# Patient Record
Sex: Male | Born: 1990
Health system: Southern US, Community
[De-identification: ages and names within clinical notes are randomized; demographics above are authoritative.]

## PROBLEM LIST (undated history)

## (undated) DIAGNOSIS — J939 Pneumothorax, unspecified: Secondary | ICD-10-CM

## (undated) HISTORY — DX: Pneumothorax, unspecified: J93.9

---
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
## 2010-01-04 ENCOUNTER — Emergency Department (HOSPITAL_COMMUNITY): Admission: EM | Admit: 2010-01-04 | Discharge: 2010-01-04 | Payer: Self-pay | Admitting: Family Medicine

## 2010-01-15 ENCOUNTER — Encounter: Admission: RE | Admit: 2010-01-15 | Discharge: 2010-01-15 | Payer: Self-pay | Admitting: Family Medicine

## 2010-01-20 ENCOUNTER — Encounter: Admission: RE | Admit: 2010-01-20 | Discharge: 2010-01-20 | Payer: Self-pay | Admitting: Family Medicine

## 2010-01-22 ENCOUNTER — Emergency Department (HOSPITAL_COMMUNITY): Admission: EM | Admit: 2010-01-22 | Discharge: 2010-01-22 | Payer: Self-pay | Admitting: Family Medicine

## 2010-06-28 ENCOUNTER — Encounter: Payer: Self-pay | Admitting: Cardiothoracic Surgery

## 2010-10-23 NOTE — H&P (Signed)
NAMERIAD, WAGLEY NO.:  [id number]   MEDICAL RECORD NO.:  [id number]          PATIENT TYPE:  AMB   LOCATION:  DFTL                         FACILITY:  [facility]   PHYSICIAN:  Kerin Perna, M.D.  DATE OF BIRTH:  [birth year]   DATE OF ADMISSION:  11/05/2005  DATE OF DISCHARGE:                                HISTORY & PHYSICAL   CHIEF COMPLAINT:  Recurrent left spontaneous pneumothorax.   HISTORY OF PRESENT ILLNESS:  The patient is a 20 year old white male who was  hospitalized at [facility] on Oct 22, 2005, through Oct 26, 2005,  for a left-sided spontaneous pneumothorax for which he was treated with a  chest tube. He had a follow-up chest x-ray which reveals recurrence of the  pneumothorax. It was Dr. Zenaida Niece Trigt's opinion that the patient should be  admitted for video-assisted thoracoscopy with probable bleb stapling and  pleurodesis. The patient currently denies any cough or sputum production. He  denies hemoptysis. He does have some mild shortness of breath with dyspnea  on exertion. He denies paroxysmal nocturnal dyspnea or orthopnea. No recent  fever, chills, or unexplained weight loss. He has no history of pneumonia.  He has no peripheral edema. He has no palpitations. He does have mild chest  discomfort that he describes as subtle.   PAST MEDICAL HISTORY:  1.  ADHD.  2.  Seasonal allergies.  3.  Hydrocele, age 35 months.   ALLERGIES:  No known drug allergies.   CURRENT MEDICATIONS:  1.  Concerta 54 mg daily, taken only on school days.  2.  Allegra p.r.n.   REVIEW OF SYSTEMS:  See the history of present illness for pertinent  positives and negatives; otherwise unremarkable.   SOCIAL HISTORY:  He is a high [employer]. He lives with his family. He  denies alcohol or tobacco use.   FAMILY HISTORY:  Negative for spontaneous pneumothorax. His family history  does include heart disease and cancer on his father's side.   PHYSICAL  EXAMINATION:  VITAL SIGNS: Blood pressure 100/60, heart rate 88,  respirations 12.  GENERAL APPEARANCE: A 20 year old Caucasian male in no acute distress.  HEENT: Normocephalic and atraumatic. Pupils equal, round, and reactive to  light. He does appear sensitive to bright lights. Extraocular movements are  intact. Oral mucosa is pink and moist. Teeth are in good condition. Mouth  and pharynx reveal no lesions. Posterior pharynx is clear of exudates or  erythema. Sclera is anicteric.  NECK: Supple with no jugular venous distention, no lymphadenopathy.  PULMONARY:  Symmetrical on inspiration and unlabored. Breath sounds are  clear, but diminished on the left compared with the right. There are no  rhonchi, wheezes, or crackles.  CARDIAC: Regular rate and rhythm without murmurs, rubs, or gallops.  ABDOMEN: Soft, nontender, nondistended. Normoactive bowel sounds. The  abdomen is flat, no bruits, no masses.  GENITOURINARY/RECTAL EXAM: Deferred.  EXTREMITIES: No edema. No cyanosis or edema. Peripheral pulses are equal and  intact bilaterally.  NEUROLOGIC: Nonfocal. He is alert and oriented times four. Gait is steady.  Muscle strength is  5+ and equal bilaterally grossly.   ASSESSMENT:  Recurrent left spontaneous pneumothorax.   PLAN:  Left video-assisted thoracoscopy and other procedures depending on  the findings during the procedure.      Rowe Clack, P.A.-C.      Kerin Perna, M.D.  Electronically Signed    WEG/MEDQ  D:  11/04/2005  T:  11/04/2005  Job:  [id number]   cc:   Gretta Arab. Valentina Lucks, M.D.  Fax: [phone]

## 2010-10-23 NOTE — Op Note (Signed)
NAMETIP, ATIENZA NO.:  [id number]   MEDICAL RECORD NO.:  [id number]          PATIENT TYPE:  INP   LOCATION:  2310                         FACILITY:  [facility]   PHYSICIAN:  Kerin Perna, M.D.  DATE OF BIRTH:  07-Jul-1990   DATE OF PROCEDURE:  11/26/2005  DATE OF DISCHARGE:                                 OPERATIVE REPORT   OPERATION:  1.  Left VATS (video-assisted thoracoscopic surgery) with stapling of apical      bleb, pleurectomy.  2.  Placement of On-Q wound irrigation system.   PREOPERATIVE DIAGNOSIS:  Recurrent spontaneous left pneumothorax, status  post prior video-assisted thoracoscopy one month ago.   POSTOPERATIVE DIAGNOSIS:  Recurrent spontaneous left pneumothorax, status  post prior video-assisted thoracoscopy one month ago.   SURGEON:  Kerin Perna, M.D.   ASSISTANT:  Coral Ceo, P.A.-C.   ANESTHESIA:  General by Dr. Judie Petit.   INDICATIONS:  The patient is a 20 year old thin white male who was treated  previously with left VATS and stapling of an apical bleb for spontaneous  pneumothorax.  He was discharged home with his lung virtually fully inflated  but returned with a gradually progressive left pneumothorax.  He was  admitted, and a Heimlich dart was inserted to re-expand the lung.  A CT scan  was performed which showed the prior surgical changes but no obvious  residual bleb disease.  He was prepared for redo VATS or minithoracotomy to  deal with the recurrent spontaneous pneumothorax.   Prior to surgery, I discussed the situation and the results of the x-ray  studies with the patient and his family and parents.  I discussed the plan  to perform a redo VATS and minithoracotomy to explore for any residual bleb  disease and to do a more aggressive pleurectomy.  The patient and parents  understood these issues and agreed to proceed under what I felt was an  informed consent.   PROCEDURE:  The patient was brought to  operating room and placed supine on  the operating table where general anesthesia was induced.  A double-lumen  endotracheal tube was positioned by the anesthesiologist, and the patient  was turned and left chest was prepped and draped as a sterile field.  A  small incision was made beneath the tip of the left scapula, and the thorax  was entered in the fourth interspace.  The previous staple lines were  inspected and found to be healing with a fibrinous exudate.  There is some  inflammation of the parietal pleura but no significant adhesions.  It is  felt that because of the postoperative changes with adherence of the upper  lobe to the lower lobe in a fissure that an open technique would allow  greater exposure of the lung, provide a more effective pleurectomy, and  would be indicated in this patient with a recurrent spontaneous  pneumothorax.  The portal incision was then extended for approximately 4 to  5 cm, and the small rib spreader was inserted.  The rib was not divided.  Through this small incision, the  upper lobe and lower lobe were examined.  There was no obvious visual bleb disease.  The superior segment of the right  lower lobe at the apex had some thickening, and it was decided to remove the  tip of the superior segment with a Endo-GIA stapler, and this was performed.  The staple line was complete and was hemostatic.  The prior staple lines  were inspected, and these were fairly well healed.  There is an area of  granulation tissue, slightly raw appearing, and this was compressed with a  new staple line using the Ethicon no-cut stapler.  The inferior pulmonary  ligament was partially taken down to allow mobility of the lung to expand  and fill the apex.  The lung was tested under water with warm saline, and  there was no air leak produced by ventilating the left lung.   The left lung was deflated.  A pleurectomy was then performed  circumferentially around the upper left  hemithorax and apex.  Hemostasis was  obtained by electrocautery.  Some of the pleural tissue was sent for  specimen.  The staple lines were inspected and reinforced with the CoSeal  adhesive material.  The lungs were again checked under water for air leak,  and no air leak was appreciated.  Two chest tubes were placed and brought  out through separate incisions using a long tunnel to drain the anterior-  posterior left hemithorax.  These were secured to the skin.  The On-Q  catheter was then placed parallel to the incision and connected to the  reservoir.  The lung was then re-expanded under direct vision.   The incision was closed with interrupted pericostal sutures of #2 Vicryl.  The chest wall muscle was closed in layers using interrupted #1 Vicryl.  The  subcutaneous and skin were closed in running Vicryl.  Sterile dressings were  applied.  The patient was extubated and returned to the recovery room in  stable condition.      Kerin Perna, M.D.  Electronically Signed     PV/MEDQ  D:  11/26/2005  T:  11/27/2005  Job:  [id number]

## 2010-10-23 NOTE — H&P (Signed)
[name], [name] NO.:  [id number]   MEDICAL RECORD NO.:  [id number]          PATIENT TYPE:  INP   LOCATION:  5016                         FACILITY:  [facility]   PHYSICIAN:  Kerin Perna, M.D.  DATE OF BIRTH:  Nov 01, 1990   DATE OF ADMISSION:  11/25/2005  DATE OF DISCHARGE:                                HISTORY & PHYSICAL   CHIEF COMPLAINT:  Recurrent left spontaneous pneumothorax.   HISTORY OF PRESENT ILLNESS:  This is a 20 year old, Caucasian male with  history of recurrent pneumothorax.  The patient was first hospitalized May  18, through May 22, for left-sided spontaneous pneumothorax, treated with  chest tube placement.  The patient had a follow-up x-ray and office visit  which showed recurrent pneumothorax.  The patient was then admitted to [facility] on June 30, and underwent a left VATS and bleb resection on  June 1.  The patient was discharged home on November 11, 2005.  The patient was  scheduled to have a followup appointment on November 26, 2005, however, last  evening, he heard a popping sound when he breathed.  The patient presented  to the CVTS office today on June 21.  The patient also noticed a popping  sound this morning.  He has not heard any popping sounds since.  The patient  did have complaint of some shortness of breath, but currently his breathing  okay.  The patient denies any orthopnea, PND, nausea, vomiting or fever.  The patient denies any cough, sputum production, hemoptysis, reflux  symptoms, chills, peripheral edema, angina, palpitations or TIA symptoms.   PAST MEDICAL HISTORY:  1.  Recurrent left spontaneous pneumothorax, status post left VATS on November 05, 2005.  2.  ADHD.  3.  Seasonal allergies.  4.  Hydrocele at age 24 months.   PAST SURGICAL HISTORY:  VATS with left apical blood resection and  pleurodesis on November 05, 2005.   ALLERGIES:  No known drug allergies.   MEDICATIONS:  1.  Ultram 50 mg 1-2 tablets p.o. 4  hours p.r.n.  2.  Concerta 55 mg p.o. daily.  3.  Allegra p.o. daily.  4.  Nasal spray p.r.n.  5.  Vitamin A, C and multivitamin daily.   REVIEW OF SYSTEMS:  See HPI for certain positives and negatives, otherwise  negative for hypothyroidism and diabetes mellitus.  The patient's review of  systems is unremarkable.   SOCIAL HISTORY:  The patient is single and lives with his family.  He denies  any use of alcohol and tobacco.  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   FAMILY HISTORY:  The patient has a positive family history of coronary  artery disease and cancer on his father's side.   PHYSICAL EXAMINATION:  VITAL SIGNS:  Stable.  The patient is afebrile.  GENERAL:  The is a 20 year old, Caucasian male in no acute distress.  HEENT:  Normocephalic, atraumatic.  Pupils equal, round and reactive to  light accommodation.  Extraocular movements intact.  Oral mucosa pink and  moist.  Sclerae nonicteric.  NECK:  Neck supple.  No bruits heard.  RESPIRATORY:  Symmetrical on inspiration.  Breathing is unlabored.  The  patient has diminished breath sounds on the left side.  CARDIAC:  Regular  rate and rhythm.  No murmur, gallop or rub.  ABDOMEN:  Soft, nontender, nondistended, normoactive bowel sounds x4.  GENITALIA/RECTAL:  Deferred.  EXTREMITIES:  No edema.  Radial, femoral, popliteal, dorsalis pedis and  posterior tibial pulses 2+ bilaterally.  NEUROLOGIC:  Nonfocal.  Alert and oriented x4.  Gait steady.  The patient's  muscle strength 5+ bilaterally and throughout.  Deep tendon reflexes 2+ and  symmetrical.   ASSESSMENT:  Recurrent left spontaneous pneumothorax.   PLAN:  1.  The patient admitted to [facility] on November 25, 2005, under Dr.      Zenaida Niece Trigt's service.  2.  The patient will have a chest tube placement by Dr. Dorris Fetch at      bedside.  3.  Dr. Dorris Fetch has seen and evaluated the patient prior to admission.      Risks and benefits of the procedure were  explained and he agreed to      proceed.  4.  Standard admission orders.      Constance Holster, [place]      Kerin Perna, M.D.  Electronically Signed    JMW/MEDQ  D:  11/25/2005  T:  11/25/2005  Job:  [id number]

## 2010-10-23 NOTE — Op Note (Signed)
NAME:  CONSTANT, MANDEVILLE NO.:  [id number]   MEDICAL RECORD NO.:  [id number]            PATIENT TYPE:   LOCATION:                                 FACILITY:   PHYSICIAN:  Kerin Perna, M.D.       DATE OF BIRTH:   DATE OF PROCEDURE:  11/08/2005  DATE OF DISCHARGE:                                 OPERATIVE REPORT   OPERATION:  Left chest tube placement for recurrent pneumothorax.   PRE-AND-POSTOPERATIVE DIAGNOSIS:  Spontaneous left pneumothorax.   SURGEON:  Kerin Perna, M.D.   ANESTHESIA:  MAC with 1% lidocaine local.   PROCEDURE:  [name] was brought to operating room for placement of a new  chest tube after he developed a large left pneumothorax following removal of  his chest tube on the floor.  It was felt that he had air enter into the  left pleural space during the removal of the chest tube.  He was brought to  the operating room where MAC anesthesia was established using IV Diprivan  and local 1% lidocaine.  The left chest was prepped and draped.   The prior chest tube site, which was 29 days old, was prepped and draped and  anesthetized with lidocaine.  A tunnel through the incision was made up the  chest wall to enter the pleural space at a more oblique angle to allow more  soft tissue to separate the two entry-and-exit sites.  This was secured with  a silk suture.  A chest x-ray was taken in the room to confirm adequate  placement; and the patient had a sterile dressing applied.      Kerin Perna, M.D.  Electronically Signed     PV/MEDQ  D:  11/08/2005  T:  11/09/2005  Job:  [id number]
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
## 2010-10-23 NOTE — Discharge Summary (Signed)
NAMECHAY, MAZZONI NO.:  [id number]   MEDICAL RECORD NO.:  [id number]          PATIENT TYPE:  INP   LOCATION:  2001                         FACILITY:  [facility]   PHYSICIAN:  Kerin Perna, M.D.  DATE OF BIRTH:  June 17, 1990   DATE OF ADMISSION:  11/25/2005  DATE OF DISCHARGE:  12/09/2005                                 DISCHARGE SUMMARY   ADMISSION DIAGNOSIS:  Recurrent spontaneous left pneumothorax status post  video assisted thoracoscopy 1 month ago.   SECONDARY DIAGNOSIS:  1.  Recurrent spontaneous left pneumothorax status post prior video assisted      thoracoscopy 1 month ago, status post redo left video assisted      thoracoscopy with bleb stapling and pleurectomy.  2.  Attention deficit hyperactivity disorder.  3.  Seasonal allergies.  4.  Hydrocele at age 57 months.   ALLERGIES:  NO KNOWN DRUG ALLERGIES.   PROCEDURES:  Left video assisted thoracoscopic surgery with stapling of the  apical bleb and pleurectomy by Dr. Kerin Perna on November 26, 2005.   BRIEF HISTORY:  Mr. Gullo is a 20 year old Caucasian male with a history of  recurrent pneumothorax.  He was first hospitalized on May 18th through May  22nd for a left-sided spontaneous pneumothorax treated with chest tube  placement.  The patient had a follow-up chest x-ray at the office, which  showed recurrent pneumothorax.  He was readmitted to [facility] on  June 30th and underwent left VATS and bleb resection with mechanical  pleurodesis on November 05, 2005.  He was discharged home on June 7th.  He was  scheduled to have a followup with Dr. Donata Clay on June 22nd, however, on  the evening of June 20th he heard a popping sound when he breathed.  He  presented to the CVTS office on June 21st and continued to notice a popping  sound.  He also complained of some shortness of breath, but was in no acute  distress.  It was felt he should be admitted for a chest tube placement and  consideration  for surgical evaluation.   HOSPITAL COURSE:  On November 25, 2005 Mr. Laino was admitted to [facility] for recurrent left spontaneous pneumothorax despite having  undergone a previous VATS procedure with bleb stapling and mechanical  pleurodesis.  Initially, a left chest tube was placed for his recurrent left  pneumothorax at approximately 50%.  He underwent a chest CT with contrast  for further evaluation.  Findings showed pleural parenchymal density at the  left apex with small loculated hydropneumothorax.  There appeared to be some  pleural in folding and retraction or tenting at the left lung apex.  Visceral pleura appeared retracted toward the side of the surgical  staples/scarring.  Although no blebs were identified on CT scan, Dr. Donata Clay felt he should undergo another left VATS procedure for further  evaluation and probable bleb stapling.  He was taken to the operating room  on June 22nd as discussed above.  He did perform resection of what appeared  to  be left apical bleb.  This was confirmed by surgical biopsy.  Postoperatively, he was transferred to the SICU overnight and was felt  stable to transfer to step-down unit 3300 by postoperative day 2.  Over the  first few days postoperatively his chest tube remained at 20 cm of suction  with a stable chest x-ray showing the lungs fully expanded, but with what  was felt to be a small left apical space.  One of his 2 chest tubes was  removed within the first few days of surgery.  Later his remaining chest  tube was placed to water seal, but there was a question of a slight increase  in the left apical pneumothorax, so was again placed to suction.  He was  allowed to disconnect suction with ambulation.  By June 30th it was  anticipated he would be placed to water seal, however, during morning rounds  it appeared that he had a 1/7 intermittent air leak.  This persisted for the  next 24 hours, but with no significant change in  his x-ray, which again  showed less than 5% left apical space.  At this point it was felt this  probably most likely was not actually due to a true air leak.  Subsequently,  his Pleur-evac system was changed out.  Another stitch was placed around his  chest tube site and a Vaseline gauze was changed.  Then over the next 24  hours or so the questionable air leak resolved.  On July 4th it was  determined he was ready for his remaining chest tube to be discontinued.  His follow-up chest x-ray showed no significant change.  He was also  breathing comfortably and incision appeared to be healing without signs of  infection.  He remained afebrile and vital signs stable.  Subsequently, he  was felt appropriate for discharge home on December 09, 2005.  Along with chest  tube management, there were some smaller issues addressed during his  hospitalization including constipation, which was addressed with laxatives,  probable lower back muscle spasm, which was addressed with a warm compress,  and tachycardia with ambulation with heart rate up to 150, but subsided when  at rest and it was felt no additional was warranted, as it was self-limited.   DISPOSITION:  Mr. Lankford was discharged home on December 09, 2005 in stable  condition.   DISCHARGE MEDICATIONS:  Ultram 50 mg 1-2 tablets p.o. q.6 hours p.r.n. pain.   DISCHARGE INSTRUCTIONS:  He may have a regular diet.  He is to avoid driving  and heavy lifting for the next 3 weeks.  He is also to avoid strenuous  activity and should increase his activity slowly.  He may shower in 2 days  and clean his incision gently with soap and water.  He may remove his 4 x 4  gauze and Vaseline gauze dressing as well in 2 days.  He should call if he  develops fevers greater than 101, redness or draining from incision site or  increasing shortness of breath.   FOLLOWUP:  He is to follow up with Dr. Kathlee Nations Trigt in the CVTS office on Wednesday, July 11th at 4 p.m. with a  chest x-ray at [facility] at 3  p.m. and he has another follow-up appointment with Dr. Donata Clay on  Wednesday, July 25th at 4 p.m. with a chest x-ray at 3 p.m.      Jerold Coombe, P.A.      Theron Arista  Donata Clay, M.D.  Electronically Signed    AWZ/MEDQ  D:  12/09/2005  T:  12/09/2005  Job:  [id number]   cc:   Kerin Perna, M.D.  [address]  [place]  [place] [address]

## 2010-10-23 NOTE — Discharge Summary (Signed)
Jordan [name], SPORN NO.:  [id number]   MEDICAL RECORD NO.:  [id number]          PATIENT TYPE:  INP   LOCATION:  3305                         FACILITY:  [facility]   PHYSICIAN:  Kerin Perna, M.D.  DATE OF BIRTH:  1991/05/26   DATE OF ADMISSION:  11/05/2005  DATE OF DISCHARGE:  11/11/2005                                 DISCHARGE SUMMARY   ADDITIONAL DIAGNOSIS:  Recurrent left spontaneous pneumothorax.   DISCHARGE DIAGNOSES:  1.  Recurrent left spontaneous pneumothorax, status post left video-assisted      thorascopic surgery .  2.  ADHD.  3.  Seasonal allergies.  4.  Hydrocele age 46 months.   CONSULTATIONS:  None.   PROCEDURES:  On November 05, 2005, the patient underwent a left VATS with left  apical blood resection and pleurodesis insertion of a 26 cm chest tube by  Dr. Kathlee Nations Trigt.  Patient's chest tube reinserted  on November 08, 2005, by  Dr. Kathlee Nations Trigt.   HISTORY OF PRESENT ILLNESS:  The patient is a 20 year old white male who was  hospitalized at [facility] on May 18-22, 2007, for left sided  spontaneous pneumothorax where he was treated with a chest tube.  The  patient had follow up chest x-ray which revealed recurrence of pneumothorax.  It was best thought that the patient should be admitted for left video-  assisted thorascopy with probable blood stapling and pleurodesis .  The  patient currently denies any cough or sputum production.  He denies any  hemoptysis.  He does have some mild shortness of breath with dyspnea on  exertion.  The patient denies PND or orthopnea.  No fever or chills or  explained weight loss.  The patient has no history of pneumonia.  No  peripheral edema.  No palpitations.  He does have mild chest discomfort that  he describes as supple.   HOSPITAL COURSE:  The patient postoperatively progressed well.  On postop  day #1, the patient's pain was controlled with Dilaudid PCA.  He did not  have chest tube.  Air leak  and __________ was minimal.  The patient's vital  signs are stable.  Postop day #2, chest x-ray was stable.  Lungs were clear.  The patient's chest tube was placed with water seal.  PCA was discontinued.  On postop day #3, the patient's chest x-ray showed a mild apical  pneumothorax, less than 5%.  His chest tube was discontinued on November 08, 2005.  Follow up chest x-ray showed current left pneumothorax, and the chest  tube was replaced under MAC.  On November 09, 2005, the patient has a mild tiny  apical pneumothorax.  His lungs were clear.  Plan was to put the patient on  Water Seal.  On November 10, 2005, the patient did not have an air leak and chest  tube will be discontinued later this afternoon by Dr. Donata Clay.  Throughout  the patient's hospital stay, his vital signs have remained stable.  The  patient is ambulating well on his own.   DISPOSITION:  The patient will be discharged to home in good condition.   MEDICATIONS:  1.  Ultram 1-2 tablets p.o. q.4 h p.r.n.  2.  Concerta 54 mg p.o. daily.  3.  Allegra p.o. daily.  4.  Nasal spray as needed.  5.  Vitamin A, vitamin C and multivitamin daily.   DISCHARGE INSTRUCTIONS:  The patient is instructed to follow a regular diet.  No driving or heavy lifting greater than 10 pounds for three weeks.  The  patient is to ambulate three to four times daily .  Activity as tolerated.  The patient is to continue his breathing exercises.  He may shower and clean  with mild soap and water.  The patient is to call the office with any  problems such as incision redness, drainage, temperature greater than 101.5.  The patient is to call the office if he experiences any increased shortness  of breath.   FOLLOWUP:  The patient has follow up appointment with Dr. Donata Clay on November 19, 2005, at 12:45 p.m. where he will have a chest x-ray taken prior to  seeing Dr. Donata Clay.      Constance Holster, [place]      Kerin Perna, M.D.  Electronically  Signed    JMW/MEDQ  D:  11/10/2005  T:  11/11/2005  Job:  [id number]

## 2010-10-23 NOTE — Op Note (Signed)
[name], [name] NO.:  [id number]   MEDICAL RECORD NO.:  [id number]          PATIENT TYPE:  INP   LOCATION:  2028                         FACILITY:  [facility]   PHYSICIAN:  Kerin Perna, M.D.  DATE OF BIRTH:  August 23, 1990   DATE OF PROCEDURE:  10/22/2005  DATE OF DISCHARGE:                                 OPERATIVE REPORT   OPERATION:  Left chest tube placement.   PREOPERATIVE DIAGNOSIS:  Spontaneous left pneumothorax.   POSTOPERATIVE DIAGNOSIS:  Spontaneous left pneumothorax.   SURGEON:  Kerin Perna, M.D.   ANESTHESIA:  Local 1% lidocaine and intravenous conscious sedation.   PROCEDURE:  Mr. Meech is a 20 year old white male who presented with a 50%  spontaneous pneumothorax which was symptomatic.  Informed consent was  obtained from the parent prior to this procedure.  The patient was given  supplemental IV of Versed and fentanyl, and the left chest was prepped and  draped as a sterile field.  Local 1% lidocaine was infiltrated in the left  breast crease.  A 2 cm incision was made, and lidocaine was infiltrated into  the subcutaneous tissue down to the intercostal muscle.  The left pleural  space was entered, and a large volume air was expelled.  The tract was  dilated gently, and a 20-French chest tube was advanced over a trocar into  the pleural space and directed to the apex.  It was secured to the skin with  two silk sutures, and a sterile dressing was placed.  The chest tube was  connected to underwater Pleur-Evac drainage system.  A portable chest x-ray  is pending.      Kerin Perna, M.D.  Electronically Signed     PV/MEDQ  D:  10/22/2005  T:  10/23/2005  Job:  [id number]   cc:   Gretta Arab. Valentina Lucks, M.D.  Fax: [phone]   CVTS office

## 2010-11-11 ENCOUNTER — Encounter: Payer: Self-pay | Admitting: Family Medicine

## 2010-11-11 ENCOUNTER — Ambulatory Visit (INDEPENDENT_AMBULATORY_CARE_PROVIDER_SITE_OTHER): Payer: BC Managed Care – PPO | Admitting: Family Medicine

## 2010-11-11 VITALS — BP 119/60 | Ht 71.0 in | Wt 173.0 lb

## 2010-11-11 DIAGNOSIS — M2140 Flat foot [pes planus] (acquired), unspecified foot: Secondary | ICD-10-CM | POA: Insufficient documentation

## 2010-11-11 DIAGNOSIS — R269 Unspecified abnormalities of gait and mobility: Secondary | ICD-10-CM | POA: Insufficient documentation

## 2010-11-11 NOTE — Progress Notes (Signed)
  Subjective:    Patient ID: Jordan [name], male    DOB: [birth year], 20 y.o.   MRN: [id number]  HPI 20 year old male referred by Dr. Margaretha Sheffield for evaluation of orthotics. 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 Margaretha Sheffield about it. He was given a Sleeve as well as some rehabilitation exercises. At that time it was noted he had significant pes planus with overpronation, thus he is referred to [place]. He states still has some calf pain but it is better. He has not tried running any more. He does not have foot pain at this time. He was previously using Vibram minimal shoes.  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 He does not smoke.  Review of Systems Denies fever, sweats, chills or loss. Also denies knee pain, hip pain, chest pain, shortness of breath, vision change, or headaches.     Objective:   Physical Exam Gen. appearance: Well-appearing male in no distress Neck supple Psych: Normal Neuro: Alert and oriented ENT: Moist mucous membranes Speech: Normal Lungs: No labored breathing Abdomen: Soft Bilateral feet: Severe pes planus with overpronation and some mid foot breakdown. He also has significant transverse arch collapse with splaying of his toes and extremely broad forefoot. He has normal posterior tibial tendon function. He has no obvious abnormal callusing. Gait: He does over pronate, but seems he tries to compensate with some supination while walking. He is unable to run today for me because his calf still bothering him.       Assessment & Plan:  Lower leg pain with severe pes planus and some overpronation relating to a gait abnormality - custom orthotics, see procedure note below -Followup as needed for adjustments  Patient was fitted for a : standard, cushioned, semi-rigid  orthotic. The orthotic was heated and afterward the patient stood on the orthotic blank positioned on the orthotic stand. The patient was positioned in subtalar neutral position and 10 degrees of ankle dorsiflexion in a weight bearing stance. After completion of molding, a stable base was applied to the orthotic blank. The blank was ground to a stable position for weight bearing. Size:12 Base: EVA Posting: 1st ray posts b/l Additional orthotic padding:  I could not remove the insoles from his shoes today, thus he will take these with him when he goes to buy new running shoes and try them on then to.  I spent greater than 30 minutes with the patient today, greater than 50% spent in face-to-face counseling time on diagnosis, prognosis, and treatment as well as making of his orthotics.

## 2011-06-16 DIAGNOSIS — Z0271 Encounter for disability determination: Secondary | ICD-10-CM

## 2011-12-01 ENCOUNTER — Other Ambulatory Visit: Payer: Self-pay | Admitting: Cardiothoracic Surgery

## 2011-12-01 ENCOUNTER — Ambulatory Visit (INDEPENDENT_AMBULATORY_CARE_PROVIDER_SITE_OTHER): Payer: BC Managed Care – PPO | Admitting: Cardiothoracic Surgery

## 2011-12-01 ENCOUNTER — Encounter: Payer: Self-pay | Admitting: Cardiothoracic Surgery

## 2011-12-01 ENCOUNTER — Ambulatory Visit
Admission: RE | Admit: 2011-12-01 | Discharge: 2011-12-01 | Disposition: A | Discharge status: Home / Self Care | Payer: BC Managed Care – PPO | Source: Ambulatory Visit | Attending: Cardiothoracic Surgery | Admitting: Cardiothoracic Surgery

## 2011-12-01 VITALS — BP 110/65 | HR 60 | Resp 18 | Ht 71.0 in | Wt 170.0 lb

## 2011-12-01 DIAGNOSIS — Z8709 Personal history of other diseases of the respiratory system: Secondary | ICD-10-CM

## 2011-12-01 DIAGNOSIS — J939 Pneumothorax, unspecified: Secondary | ICD-10-CM

## 2011-12-01 DIAGNOSIS — Z09 Encounter for follow-up examination after completed treatment for conditions other than malignant neoplasm: Secondary | ICD-10-CM

## 2011-12-01 NOTE — Progress Notes (Signed)
PCP is No primary provider on file. Referring Provider is Maurice Small, MD  Chief Complaint  Patient presents with  . 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 He is a nonsmoker. He has no pulmonary symptoms whatsoever. He has no postthoracotomy pain. The surgical incisions are well-healed. He has no shortness of breath with heavy exertion. He has no problems lifting heavy weights. A followup chest x-ray today shows normal lung fields, no evidence of residual pneumothorax, no pleural effusion present.   No past medical history on file.  No past surgical history on file.  No family history on file.  Social History History  Substance Use Topics  . Smoking status: Never Smoker   . Smokeless tobacco: Not on file  . Alcohol Use: Not on file    No current outpatient prescriptions on file.    No Known Allergies  Review of Systems no fever no shortness of breath no weight loss no chest pain  BP 110/65  Pulse 60  Resp 18  Ht 5\' 11"  (1.803 m)  Wt 170 lb (77.111 kg)  BMI 23.71 kg/m2  SpO2 98% Physical Exam Alert and comfortable Neck without mass or JVD Thorax well-healed left mini thoracotomy incision and chest tube sites Cardiac rhythm regular murmur or gallop Extremities without deformity      normal pulses no edema Neuro exam without focal motor deficit ,normal gait Diagnostic Tests: Chest x-ray no active disease  Impression: Resolved bleb disease causing pneumothorax. No physical limitations to this patient's activity. No expectation of future pulmonary problems related to previous thoracic surgery.  Plan: Return as needed

## 2012-04-19 ENCOUNTER — Ambulatory Visit (INDEPENDENT_AMBULATORY_CARE_PROVIDER_SITE_OTHER): Payer: BC Managed Care – PPO | Admitting: Sports Medicine

## 2012-04-19 ENCOUNTER — Encounter: Payer: Self-pay | Admitting: Sports Medicine

## 2012-04-19 VITALS — BP 103/70 | HR 85 | Ht 71.0 in | Wt 170.0 lb

## 2012-04-19 DIAGNOSIS — M7740 Metatarsalgia, unspecified foot: Secondary | ICD-10-CM

## 2012-04-19 DIAGNOSIS — M7750 Other enthesopathy of unspecified foot: Secondary | ICD-10-CM

## 2012-04-19 DIAGNOSIS — M2140 Flat foot [pes planus] (acquired), unspecified foot: Secondary | ICD-10-CM

## 2012-04-19 NOTE — Progress Notes (Signed)
  Subjective:    Patient ID: Jordan [name], male    DOB: 1990-12-22, 21 y.o.   MRN: [id number]  HPI chief complaint: Left foot pain 21 year old male comes in today complaining of 5 weeks of left foot pain. He describes pain along the third metatarsal head which is worse with walking and running. His symptoms began acutely after he awoke after sleeping in the back of a car during a pig roast. For the first couple of weeks his pain was excruciating and he had difficulty bearing weight. Since that time his symptoms have improved but not resolved. He has seen a chiropractor and had x-rays of his foot which are available for review. He has a history of a tibial stress fracture on the right but the pain in his left foot is different than what he experienced with his previous stress fracture in his right lower leg. He's not noticed any swelling. He has been able to continue running. 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 Most of his pain occurs now while walking barefoot. He has custom orthotics which he wears in his running shoes but he does not wear these in his other shoes.    Review of Systems     Objective:   Physical Exam Well-developed, well-nourished. No acute distress  Left foot: There is tenderness to palpation at the third metatarsal head. Slight callus buildup here as well. There is no tenderness to palpation or percussion across the dorsum of his foot. No pain with metatarsal squeeze. He has significant pes planus and collapse of the transverse arch with standing. Neurovascular intact distally. Walking without a limp.  [facility] ultrasound of his left foot shows increased blood flow at the third metatarsal head. This is seen on the plantar aspect of his foot but the remainder of the metatarsal appears  to be within normal limits. Scan of the metatarsals dorsally is unremarkable. No obvious stress fracture. No abnormal hypoechogenicity.  X-rays of his left foot from a local chiropractor's office are reviewed. No abnormalities are seen. Specifically no evidence of stress fracture.      Assessment & Plan:  1. Left foot pain secondary to third metatarsalgia 2. Pes planus with transverse arch collapse  I reassured him that I see no evidence of stress fracture either clinically or on today's ultrasound. I do think he is dealing with metatarsalgia and I've given him some metatarsal pads on some sports insoles to wear in his regular shoes. He will also bring in his custom orthotics so we can fit does with metatarsal pads. He is now a Product manager with running which is a conscious change in his form. He was traditionally a heel striker. I recommended that he continue with his heel striking form or a more neutral striking pattern and tell his metatarsalgia resolves. At that point, he may want to remove the metatarsal pads from his orthotics. I reassured him that with continued relative rest and metatarsl pads that this should resolve. He can increase activity as tolerated. Follow up prn. 2. Pes planus with collapse of the transverse arch

## 2012-05-08 ENCOUNTER — Encounter: Payer: Self-pay | Admitting: Sports Medicine

## 2012-05-08 ENCOUNTER — Ambulatory Visit (INDEPENDENT_AMBULATORY_CARE_PROVIDER_SITE_OTHER): Payer: BC Managed Care – PPO | Admitting: Sports Medicine

## 2012-05-08 VITALS — BP 117/74 | HR 65 | Ht 71.0 in | Wt 170.0 lb

## 2012-05-08 DIAGNOSIS — M79609 Pain in unspecified limb: Secondary | ICD-10-CM

## 2012-05-08 DIAGNOSIS — M79673 Pain in unspecified foot: Secondary | ICD-10-CM

## 2012-05-08 NOTE — Progress Notes (Signed)
  Subjective:    Patient ID: Jordan [name], male    DOB: [birth year], 21 y.o.   MRN: [id number]  HPI Patient comes in with persistent concerns about his left foot. An ultrasound done previously showed increased Doppler flow around the third metatarsal head but no obvious stress fracture. He continues to have intermittent pain along the plantar aspect of his foot. His pain is sharp and lasts only one to 2 seconds. Seems to be most noticeable with toe flexion and extension with the most discomfort coming while doing pushups. He's been able to run without any pain. Not noticed any swelling. His mother was questioning whether or not he may need a walking boot for a period of time but the patient states that he is not currently limping.    Review of Systems     Objective:   Physical Exam Well-developed, well-nourished. No acute distress.  Left foot: There is some slight tenderness to palpation over the third metatarsal head. There is also pain with passive flexion and extension of the third toe. No soft tissue swelling. No tenderness to palpation across the dorsum of his foot. Neurovascularly intact distally. He walks without a limp.  Limited [facility] ultrasound of the left foot was performed. A longitudinal view of the plantar aspect of the third metatarsal head shows some edema surrounding the flexor tendon at this level. The neovascularity seen on his last scan is no longer apparent. There is no obvious stress fracture.       Assessment & Plan:  1. Persistent left foot pain likely secondary to flexor tendon strain  Reassurance regarding his ultrasound. I do not see any evidence of stress fracture nor do I think he would benefit from immobilization in a Cam Walker. He will avoid aggravating activities and can resume activity as tolerated. He has custom orthotics which appear to be in fairly good condition. He will followup when necessary.

## 2014-01-29 ENCOUNTER — Other Ambulatory Visit: Payer: Self-pay | Admitting: Physician Assistant

## 2014-01-29 ENCOUNTER — Ambulatory Visit
Admission: RE | Admit: 2014-01-29 | Discharge: 2014-01-29 | Disposition: A | Discharge status: Home / Self Care | Payer: BC Managed Care – PPO | Source: Ambulatory Visit | Attending: Physician Assistant | Admitting: Physician Assistant

## 2014-01-29 DIAGNOSIS — R079 Chest pain, unspecified: Secondary | ICD-10-CM

## 2014-06-26 ENCOUNTER — Other Ambulatory Visit: Payer: Self-pay | Admitting: Orthopedic Surgery

## 2014-06-26 DIAGNOSIS — M940 Chondrocostal junction syndrome [Tietze]: Secondary | ICD-10-CM

## 2014-07-02 ENCOUNTER — Ambulatory Visit
Admission: RE | Admit: 2014-07-02 | Discharge: 2014-07-02 | Disposition: A | Discharge status: Home / Self Care | Payer: 59 | Source: Ambulatory Visit | Attending: Orthopedic Surgery | Admitting: Orthopedic Surgery

## 2014-07-02 DIAGNOSIS — M940 Chondrocostal junction syndrome [Tietze]: Secondary | ICD-10-CM

## 2014-11-06 ENCOUNTER — Institutional Professional Consult (permissible substitution): Payer: 59 | Admitting: Emergency Medicine

## 2014-11-22 ENCOUNTER — Encounter: Payer: Self-pay | Admitting: Emergency Medicine

## 2014-11-22 ENCOUNTER — Other Ambulatory Visit: Payer: Self-pay | Admitting: Emergency Medicine

## 2014-11-22 ENCOUNTER — Ambulatory Visit (INDEPENDENT_AMBULATORY_CARE_PROVIDER_SITE_OTHER): Payer: 59 | Admitting: Emergency Medicine

## 2014-11-22 VITALS — BP 122/76 | HR 66 | Ht 72.0 in | Wt 182.0 lb

## 2014-11-22 DIAGNOSIS — R0600 Dyspnea, unspecified: Secondary | ICD-10-CM

## 2014-11-22 NOTE — Patient Instructions (Signed)
We will perform full pulmonary function testing. Depending on the results we may also consider further imaging of her chest or possibly an cardiopulmonary exercise test We will perform blood work either today or on your return visit Follow with Dr Lamonte Sakai next available with pulmonary function testing on the same day

## 2014-11-22 NOTE — Progress Notes (Signed)
Subjective:    Patient ID: Jordan [name], male    DOB: Jan 01, 1991, 24 y.o.   MRN: [id number]  HPI 24 year old never smoker with little past medical history the exception of resection of a left apical bleb by VATS and pleurectomy in 2007 after a spontaneous PTX.  Also with a hx peristernal costochondritis, was ultimately treated with pred with some improvement. He is referred for dyspnea. He reports that ever since '07 he has had some dyspnea, difficulty getting a full breath in. This can be random, but is worse after a workout or when laying supine, sometimes 10 minutes after. Not really associated w pain. The history is taken from the patient and his wife who is present. I have reviewed his prior notes from Dr. Prescott Gum, I have reviewed his CT scan of the chest from January 2016.    Review of Systems  Constitutional: Negative for fever, chills, activity change, appetite change and unexpected weight change.  HENT: Negative for congestion, dental problem, postnasal drip, rhinorrhea, sneezing, sore throat, trouble swallowing and voice change.   Eyes: Negative for visual disturbance.  Respiratory: Positive for chest tightness and shortness of breath. Negative for cough and choking.   Cardiovascular: Negative for chest pain and leg swelling.  Gastrointestinal: Negative for nausea, vomiting and abdominal pain.  Genitourinary: Negative for difficulty urinating.  Musculoskeletal: Negative for arthralgias.  Skin: Negative for rash.  Psychiatric/Behavioral: Negative for behavioral problems and confusion.    Past Medical History  Diagnosis Date  . Pneumothorax      Family History  Problem Relation Age of Onset  . Breast cancer Mother   . Prostate cancer Father   . Lung cancer Paternal Grandfather   . Bladder Cancer Father   . Skin cancer Father      History   Social History  . Marital Status: Married    Spouse Name: N/A  . Number of Children: N/A  . Years of Education: N/A    Occupational History  . Not on file.   Social History Main Topics  . Smoking status: Never Smoker   . Smokeless tobacco: Never Used  . Alcohol Use: Not on file  . Drug Use: Not on file  . Sexual Activity: Not on file   Other Topics Concern  . Not on file   Social History Narrative  Works as a Quarry manager.    No Known Allergies   No outpatient prescriptions prior to visit.   No facility-administered medications prior to visit.         Objective:   Physical Exam Filed Vitals:   11/22/14 1526  BP: 122/76  Pulse: 66  Height: 6' (1.829 m)  Weight: 182 lb (82.555 kg)  SpO2: 96%   Gen: Pleasant, well-nourished, in no distress,  normal affect  ENT: No lesions,  mouth clear,  oropharynx clear, no postnasal drip  Neck: No JVD, no TMG, no carotid bruits  Lungs: No use of accessory muscles, clear without rales or rhonchi  Cardiovascular: RRR, heart sounds normal, no murmur or gallops, no peripheral edema  Musculoskeletal: No deformities, no cyanosis or clubbing  Neuro: alert, non focal  Skin: Warm, no lesions or rashes    07/03/14 --  COMPARISON: Radiographs 01/29/2014  FINDINGS: There are apical blebs bilaterally as well as linear scarring in the left apex. The lungs are otherwise clear. No masses or nodules are evident. Central airways are clear.  There is no hilar or mediastinal adenopathy. There are no effusions. The  thoracic esophagus appears unremarkable.  The sternum, costosternal junctions and costochondral junctions appear unremarkable. No bone lesion, bony destruction, chest wall lesion, drainable collection or focal inflammatory change is evident.  IMPRESSION: Small apical blebs bilaterally. Mild linear scarring in the left apex likely relates to the history of prior thoracotomy and bleb resection. No sternal or chest wall abnormalities are evident to account for the described pain.     Assessment & Plan:  Dyspnea Based on his  description this sounds anatomical and restrictive. Interestingly he does not have exercise limitation. I suspect that it may relate to his prior pleurectomy and surgery for his bleb. Consider also other causes like hemidiaphragm immobility but I'll suspect this problem to affect him during exercise. Etiology of his blebs is unclear it may be due to was body habitus and tall stature. I would like to check an alpha-1 antitrypsin although there is no history of this problem in his family. We will perform full pulmonary function testing and then consider a cardiopulmonary exercise test and other imaging

## 2014-11-22 NOTE — Assessment & Plan Note (Signed)
Based on his description this sounds anatomical and restrictive. Interestingly he does not have exercise limitation. I suspect that it may relate to his prior pleurectomy and surgery for his bleb. Consider also other causes like hemidiaphragm immobility but I'll suspect this problem to affect him during exercise. Etiology of his blebs is unclear it may be due to was body habitus and tall stature. I would like to check an alpha-1 antitrypsin although there is no history of this problem in his family. We will perform full pulmonary function testing and then consider a cardiopulmonary exercise test and other imaging

## 2014-11-29 ENCOUNTER — Ambulatory Visit (HOSPITAL_COMMUNITY)
Admission: RE | Admit: 2014-11-29 | Discharge: 2014-11-29 | Disposition: A | Discharge status: Home / Self Care | Payer: 59 | Source: Ambulatory Visit | Attending: Emergency Medicine | Admitting: Emergency Medicine

## 2014-11-29 DIAGNOSIS — R0600 Dyspnea, unspecified: Secondary | ICD-10-CM | POA: Diagnosis present

## 2014-11-29 LAB — PULMONARY FUNCTION TEST
DL/VA % PRED: 99 %
DL/VA: 4.72 ml/min/mmHg/L
DLCO UNC % PRED: 124 %
DLCO unc: 41.93 ml/min/mmHg
FEF 25-75 PRE: 4.57 L/s
FEF 25-75 Post: 2.06 L/sec
FEF2575-%CHANGE-POST: -55 %
FEF2575-%PRED-POST: 43 %
FEF2575-%PRED-PRE: 96 %
FEV1-%Change-Post: -28 %
FEV1-%PRED-PRE: 110 %
FEV1-%Pred-Post: 78 %
FEV1-POST: 3.68 L
FEV1-PRE: 5.16 L
FEV1FVC-%Change-Post: -15 %
FEV1FVC-%PRED-PRE: 90 %
FEV6-%CHANGE-POST: -13 %
FEV6-%PRED-POST: 102 %
FEV6-%Pred-Pre: 118 %
FEV6-Post: 5.78 L
FEV6-Pre: 6.72 L
FEV6FVC-%Change-Post: 0 %
FEV6FVC-%Pred-Post: 101 %
FEV6FVC-%Pred-Pre: 100 %
FVC-%Change-Post: -15 %
FVC-%Pred-Post: 101 %
FVC-%Pred-Pre: 120 %
FVC-POST: 5.78 L
FVC-Pre: 6.87 L
POST FEV1/FVC RATIO: 64 %
PRE FEV1/FVC RATIO: 75 %
Post FEV6/FVC ratio: 100 %
Pre FEV6/FVC Ratio: 100 %
RV % pred: 128 %
RV: 2.06 L
TLC % PRED: 124 %
TLC: 8.8 L

## 2014-11-29 MED ORDER — ALBUTEROL SULFATE (2.5 MG/3ML) 0.083% IN NEBU
2.5000 mg | INHALATION_SOLUTION | Freq: Once | RESPIRATORY_TRACT | Status: AC
  Administered 2014-11-29: 2.5 mg via RESPIRATORY_TRACT

## 2015-01-16 ENCOUNTER — Ambulatory Visit: Payer: 59 | Admitting: Emergency Medicine

## 2015-09-30 ENCOUNTER — Emergency Department (HOSPITAL_BASED_OUTPATIENT_CLINIC_OR_DEPARTMENT_OTHER): Payer: Worker's Compensation

## 2015-09-30 ENCOUNTER — Emergency Department (HOSPITAL_BASED_OUTPATIENT_CLINIC_OR_DEPARTMENT_OTHER)
Admission: EM | Admit: 2015-09-30 | Discharge: 2015-09-30 | Disposition: A | Discharge status: Home / Self Care | Payer: Worker's Compensation | Attending: Emergency Medicine | Admitting: Emergency Medicine

## 2015-09-30 ENCOUNTER — Encounter (HOSPITAL_BASED_OUTPATIENT_CLINIC_OR_DEPARTMENT_OTHER): Payer: Self-pay | Admitting: *Deleted

## 2015-09-30 DIAGNOSIS — W230XXA Caught, crushed, jammed, or pinched between moving objects, initial encounter: Secondary | ICD-10-CM | POA: Diagnosis not present

## 2015-09-30 DIAGNOSIS — Y9389 Activity, other specified: Secondary | ICD-10-CM | POA: Diagnosis not present

## 2015-09-30 DIAGNOSIS — Z8709 Personal history of other diseases of the respiratory system: Secondary | ICD-10-CM | POA: Diagnosis not present

## 2015-09-30 DIAGNOSIS — Y990 Civilian activity done for income or pay: Secondary | ICD-10-CM | POA: Diagnosis not present

## 2015-09-30 DIAGNOSIS — S63501A Unspecified sprain of right wrist, initial encounter: Secondary | ICD-10-CM | POA: Diagnosis not present

## 2015-09-30 DIAGNOSIS — Y9289 Other specified places as the place of occurrence of the external cause: Secondary | ICD-10-CM | POA: Diagnosis not present

## 2015-09-30 DIAGNOSIS — S6991XA Unspecified injury of right wrist, hand and finger(s), initial encounter: Secondary | ICD-10-CM | POA: Diagnosis present

## 2015-09-30 MED ORDER — NAPROXEN 500 MG PO TABS: 500.0000 mg | ORAL_TABLET | Freq: Two times a day (BID) | ORAL | Status: DC

## 2015-09-30 NOTE — Discharge Instructions (Signed)
Many simple wrist injuries resolve without specific treatment. I recommend that you follow up with your employers occupational health physician.   Triangular Fibrocartilage Tear With Rehab A triangular fibrocartilage tear is a tear in the triangular fibrocartilage complex (TFCC). The TFCC is a structure within the wrist. The TFCC along with the ulnocarpal ligament help stabilize the pinky side of the hand and forearm bones. A tear in the TFCC causes pain, instability, and/or a clicking sensation in the wrist. SYMPTOMS   Pain, tenderness and/or inflammation around the pinky (ulnar) side of the wrist.  Pain that worsens with wrist movement, especially extension of the wrist, or gripping.  "Clicking" sensation with or without pain.  A crackling sound (crepitation) with motion of the wrist. CAUSES  Triangular fibrocartilage tears occur when a force is placed on the TFCC that is greater than it can withstand. Common ways the injury occurs include:  Degeneration of the TFCC that occurs with age.  Falling on an outstretched hand.  One of the forearm bones (the ulna) is longer than the other (the radius). This pinches the TFCC. RISK INCREASES WITH:  Activities that involve repetitive and/or strenuous wrist and hand motions (like racquet sports, golf, baseball, weightlifting or mountain biking).  Activities that have a risk of falling on an outstretched hand.  Poor strength and flexibility.  Poor technique. PREVENTION  Warm up and stretch properly before activity.  Maintain physical fitness:  Strength, flexibility and endurance.  Cardiovascular fitness.  Learn and use proper technique. When possible, have a coach correct improper technique. PROGNOSIS  If treated properly, the symptoms of triangular fibrocartilage tears usually resolve with non-surgical treatment. Occasionally surgery is necessary to eliminate the symptoms. RELATED COMPLICATIONS  Recurrent symptoms that result in  a long term problem.  Prolonged healing time, if improperly treated or re-injured.  Arthritis of the wrist.  Inability to return to a high level of play.  Wrist stiffness or decreased wrist function.  Risks of surgery: infection, bleeding, nerve damage or damage to surrounding tissues. TREATMENT Treatment initially involves resting from any activities that aggravate the symptoms, and the use of ice and medications to help reduce pain and inflammation. It may be necessary to keep the wrist still (immobilized) for a period of time to allow for healing of the wrist. After immobilization it is important to perform strengthening and stretching exercises to help regain strength and a full range of motion. These exercises may be completed at home or with a therapist. If symptoms persist for greater than 6 months despite non-surgical treatment, then surgery may be recommended. MEDICATION  If pain medication is necessary, then nonsteroidal anti-inflammatory medications, such as aspirin and ibuprofen, or other minor pain relievers, such as acetaminophen, are often recommended.  Do not take pain medication for 7 days before surgery.  Prescription pain relievers may be given if deemed necessary by your caregiver. Use only as directed and only as much as you need. COLD THERAPY Cold treatment (icing) relieves pain and reduces inflammation. Cold treatment should be applied for 10 to 15 minutes every 2 to 3 hours for inflammation and pain and immediately after any activity that aggravates your symptoms. Use ice packs or massage the area with a piece of ice (ice massage). SEEK MEDICAL CARE IF:  Treatment seems to offer no benefit, or the condition worsens.  Any medications produce adverse side effects.  Any complications from surgery occur:  Pain, numbness or coldness in the extremity operated upon.  Discoloration of the nail beds (  they become blue or gray) of the extremity operated upon.  Signs of  infections (fever, pain, inflammation, redness or persistent bleeding).Gently grip a hammer or a soup ladle.  Wrist Sprain With Rehab A sprain is an injury in which a ligament that maintains the proper alignment of a joint is partially or completely torn. The ligaments of the wrist are susceptible to sprains. Sprains are classified into three categories. Grade 1 sprains cause pain, but the tendon is not lengthened. Grade 2 sprains include a lengthened ligament because the ligament is stretched or partially ruptured. With grade 2 sprains there is still function, although the function may be diminished. Grade 3 sprains are characterized by a complete tear of the tendon or muscle, and function is usually impaired. SYMPTOMS   Pain tenderness, inflammation, and/or bruising (contusion) of the injury.  A "pop" or tear felt and/or heard at the time of injury.  Decreased wrist function. CAUSES  A wrist sprain occurs when a force is placed on one or more ligaments that is greater than it/they can withstand. Common mechanisms of injury include:  Catching a ball with your hands.  Repetitive and/ or strenuous extension or flexion of the wrist. RISK INCREASES WITH:  Previous wrist injury.  Contact sports (boxing or wrestling).  Activities in which falling is common.  Poor strength and flexibility.  Improperly fitted or padded protective equipment. PREVENTION  Warm up and stretch properly before activity.  Allow for adequate recovery between workouts.  Maintain physical fitness:  Strength, flexibility, and endurance.  Cardiovascular fitness.  Protect the wrist joint by limiting its motion with the use of taping, braces, or splints.  Protect the wrist after injury for 6 to 12 months. PROGNOSIS  The prognosis for wrist sprains depends on the degree of injury. Grade 1 sprains require 2 to 6 weeks of treatment. Grade 2 sprains require 6 to 8 weeks of treatment, and grade 3 sprains require up  to 12 weeks.  RELATED COMPLICATIONS   Prolonged healing time, if improperly treated or re-injured.  Recurrent symptoms that result in a chronic problem.  Injury to nearby structures (bone, cartilage, nerves, or tendons).  Arthritis of the wrist.  Inability to compete in athletics at a high level.  Wrist stiffness or weakness.  Progression to a complete rupture of the ligament. TREATMENT  Treatment initially involves resting from any activities that aggravate the symptoms, and the use of ice and medications to help reduce pain and inflammation. Your caregiver may recommend immobilizing the wrist for a period of time in order to reduce stress on the ligament and allow for healing. After immobilization it is important to perform strengthening and stretching exercises to help regain strength and a full range of motion. These exercises may be completed at home or with a therapist. Surgery is not usually required for wrist sprains, unless the ligament has been ruptured (grade 3 sprain). MEDICATION   If pain medication is necessary, then nonsteroidal anti-inflammatory medications, such as aspirin and ibuprofen, or other minor pain relievers, such as acetaminophen, are often recommended.  Do not take pain medication for 7 days before surgery.  Prescription pain relievers may be given if deemed necessary by your caregiver. Use only as directed and only as much as you need. HEAT AND COLD  Cold treatment (icing) relieves pain and reduces inflammation. Cold treatment should be applied for 10 to 15 minutes every 2 to 3 hours for inflammation and pain and immediately after any activity that aggravates your symptoms. Use  ice packs or massage the area with a piece of ice (ice massage).  Heat treatment may be used prior to performing the stretching and strengthening activities prescribed by your caregiver, physical therapist, or athletic trainer. Use a heat pack or soak your injury in warm water. SEEK  MEDICAL CARE IF:  Treatment seems to offer no benefit, or the condition worsens.  Any medications produce adverse side effects. EXER

## 2015-09-30 NOTE — ED Notes (Signed)
Right wrist injury yesterday. Elevator door shut on his wrist. Workman's comp. He works for the city. No drug screen required.

## 2015-09-30 NOTE — ED Provider Notes (Signed)
CSN: [id number]     Arrival date & time 09/30/15  1824 History  By signing my name below, I, Helane Gunther, attest that this documentation has been prepared under the direction and in the presence of Tanna Furry, MD. Electronically Signed: Helane Gunther, ED Scribe. 09/30/2015. 7:25 PM.     Chief Complaint  Patient presents with  . Wrist Pain   The history is provided by the patient. No language interpreter was used.   HPI Comments: Jordan [name] is a 25 y.o. male who presents to the Emergency Department complaining of constant, burning right wrist pain onset after an injury sustained 1 day ago. Pt states he was trying to keep an elevator door open at work when his wrist was caught between the closing doors, causing his the joint to hyperextend, at which time pt felt a "pop." He notes this has happened in the past without any adverse effects, but he states that he now feels a burning pain to the ulnar point of the wrist, which has not resolved. He has also noticed some limitation to lifting anything with the right hand, especially with rotating his hands upward. He reports that he was seen for the similar past injuries, at which time he was diagnosed with a tendon strain. Pt denies any current swelling to the joint.   Past Medical History  Diagnosis Date  . Pneumothorax    History reviewed. No pertinent past surgical history. Family History  Problem Relation Age of Onset  . Breast cancer Mother   . Prostate cancer Father   . Lung cancer Paternal Grandfather   . Bladder Cancer Father   . Skin cancer Father    Social History  Substance Use Topics  . Smoking status: Never Smoker   . Smokeless tobacco: Never Used  . Alcohol Use: No    Review of Systems  Constitutional: Negative for fever, chills, diaphoresis, appetite change and fatigue.  HENT: Negative for mouth sores, sore throat and trouble swallowing.   Eyes: Negative for visual disturbance.  Respiratory: Negative for cough,  chest tightness and wheezing.   Gastrointestinal: Negative for nausea, vomiting, diarrhea and abdominal distention.  Endocrine: Negative for polydipsia, polyphagia and polyuria.  Genitourinary: Negative for dysuria, frequency and hematuria.  Musculoskeletal: Positive for arthralgias. Negative for joint swelling and gait problem.  Skin: Negative for color change, pallor and rash.  Neurological: Negative for dizziness, syncope and light-headedness.  Hematological: Does not bruise/bleed easily.  Psychiatric/Behavioral: Negative for behavioral problems and confusion.    Allergies  Review of patient's allergies indicates no known allergies.  Home Medications   Prior to Admission medications   Medication Sig Start Date End Date Taking? Authorizing Provider  naproxen (NAPROSYN) 500 MG tablet Take 1 tablet (500 mg total) by mouth 2 (two) times daily. 09/30/15   Tanna Furry, MD   BP 142/50 mmHg  Pulse 85  Temp(Src) 98.7 F (37.1 C) (Oral)  Resp 18  Ht 5\' 11"  (1.803 m)  Wt 175 lb (79.379 kg)  BMI 24.42 kg/m2  SpO2 99% Physical Exam  Constitutional: He is oriented to person, place, and time. He appears well-developed and well-nourished. No distress.  HENT:  Head: Normocephalic.  Eyes: Conjunctivae are normal. Pupils are equal, round, and reactive to light. No scleral icterus.  Neck: Normal range of motion. Neck supple. No thyromegaly present.  Cardiovascular: Normal rate and regular rhythm.  Exam reveals no gallop and no friction rub.   No murmur heard. Pulmonary/Chest: Effort normal and breath sounds  normal. No respiratory distress. He has no wheezes. He has no rales.  Abdominal: Soft. Bowel sounds are normal. He exhibits no distension. There is no tenderness. There is no rebound.  Musculoskeletal: Normal range of motion. He exhibits tenderness. He exhibits no edema.  TTP and pain with ROM over the right TFCC, no obvious soft-tissue swelling  Neurological: He is alert and oriented to  person, place, and time.  Skin: Skin is warm and dry. No rash noted.  Psychiatric: He has a normal mood and affect. His behavior is normal.    ED Course  Procedures  DIAGNOSTIC STUDIES: Oxygen Saturation is 99% on RA, normal by my interpretation.    COORDINATION OF CARE: 7:23 PM - Discussed XR results and plans to order a splint. Will write note for work to restrict pt to "light duty." Advised to take OTC anti-inflammatory medications, as well as to apply cold compresses. Pt advised of plan for treatment and pt agrees.  Labs Review Labs Reviewed - No data to display  Imaging Review Dg Wrist Complete Right  09/30/2015  CLINICAL DATA:  The patient's right wrist became caught between elevator doors yesterday. Continued pain. Initial encounter. EXAM: RIGHT WRIST - COMPLETE 3+ VIEW COMPARISON:  None. FINDINGS: There is no evidence of fracture or dislocation. There is no evidence of arthropathy. Small bone island in the scaphoid is incidentally noted. Soft tissues are unremarkable. IMPRESSION: Negative exam. Electronically Signed   By: Inge Rise M.D.   On: 09/30/2015 19:12   I have personally reviewed and evaluated these images and lab results as part of my medical decision-making.   EKG Interpretation None      MDM   Final diagnoses:  Wrist sprain, right, initial encounter    I personally performed the services described in this documentation, which was scribed in my presence. The recorded information has been reviewed and is accurate.   Tanna Furry, MD 09/30/15 [phone]

## 2016-11-15 DIAGNOSIS — M25512 Pain in left shoulder: Secondary | ICD-10-CM | POA: Diagnosis not present

## 2016-12-21 ENCOUNTER — Encounter: Payer: Self-pay | Admitting: Sports Medicine

## 2016-12-21 ENCOUNTER — Ambulatory Visit (INDEPENDENT_AMBULATORY_CARE_PROVIDER_SITE_OTHER): Payer: 59 | Admitting: Sports Medicine

## 2016-12-21 VITALS — BP 120/70 | Ht 71.0 in | Wt 180.0 lb

## 2016-12-21 DIAGNOSIS — M25512 Pain in left shoulder: Secondary | ICD-10-CM

## 2016-12-21 NOTE — Progress Notes (Signed)
  Chief complaint:  Left shoulder pain 6 months  History of present illness: Jordan [name] is a 26 year old Caucasian male, with past medical history notable for ADD and history of left spontaneous pneumothorax 11 years ago, status post two thirds upper lobe resection, who presents to the sports medicine office today with chief complaint of left shoulder pain. He reports that pain has been ongoing for the last 6 months. The reports of specific incidents approximately 7 months ago. He reports that the bedsheet one morning was covering his left shoulder, he was sitting on the rest of the bedsheet, reports that he was sitting up in the bed and the bedsheet "pulled my arm back". He reports since that time having a "deep" pain in his left shoulder. He cannot exactly pinpoint where an shoulder she feels the pain. He reports since that time having popping and catching on a daily basis. He does not report of any numbness, tingling, or weakness of his left arm. He is right hand dominant. He does not report of any warmth, erythema, ecchymosis, or effusion of left shoulder. He reports specific activities such as doing military presses, for flexion, and shoulder abduction causes pain. He has not tried any specific therapy, takes occasional ibuprofen for relief of symptoms. He has not had any imaging of his shoulder. He does not report of any previous injury to his left shoulder.  Past medical history: ADD Spontaneous pneumothorax status post left upper two thirds lobe of lungresection  Past surgical history: Left upper lobe two thirds lung resected secondary to spontaneous pneumothorax  Family history:  Father-bladder cancer and skin cancer Mother-skin cancer  Social history: Does not report of any tobacco, alcohol, or illicit drug use, is a Sports administrator in [employer]  Allergies: No known drug allergies  Medications: Adderall  Review of systems: Constitutional: No fevers, chills, or night  sweats Respiratory: No shortness of breath or cough Cardiac: No chest pain Integumentary: No rashes or pruritus  Physical exam: Gen: Lower, oriented, appears stated age, no apparent distress HEENT: Moist oral mucosa Respiratory: Normal respirations, speaks in full sentences Cardiac: Normal peripheral pulses, regular rate Integumentary: No rashes are visible skin Musculoskeletal:  Inspection of left shoulder does not reveal obvious deformity or atrophy, no warmth, erythema, ecchymosis, or effusion noted, no tenderness to palpation elicited and clavicle, acromioclavicular joint, coracoid, acromion, head of biceps, and scapular spine, range of motion intact both active and passively, speed's, Yergason, O'Brien, Neer's, drop arm, Michel Bickers, and [facility] all negative, crank equivocal, strength 5/5 bilateral upper extremity, sensation 2+ in bilateral upper extremity  Assessment and plan: 1. Left shoulder pain, concern for labral pathology  Left shoulder pain -Given physical exam findings concerning for, at least partial labral tear, discussed options of pursuing MRI arthrogram or just doing physical therapy for now, after discussion with patient he opts just to do physical therapy for now -If no improvement in symptoms in 4 weeks, we'll obtain MRI arthrogram to rule out labral tear  Will have patient follow-up in 4-6 weeks or sooner as needed.   Patient seen and evaluated with the sports medicine fellow. I agree with the above plan of care. Patient's history and physical exam findings are concerning for labral tear. He would like to try physical therapy first. Follow-up with me in 4-6 weeks for reevaluation. If no improvement, consider MRI arthrogram. Call with questions or concerns in the interim.

## 2017-01-05 DIAGNOSIS — M25512 Pain in left shoulder: Secondary | ICD-10-CM | POA: Diagnosis not present

## 2017-01-05 DIAGNOSIS — S46812D Strain of other muscles, fascia and tendons at shoulder and upper arm level, left arm, subsequent encounter: Secondary | ICD-10-CM | POA: Diagnosis not present

## 2017-01-11 DIAGNOSIS — M25512 Pain in left shoulder: Secondary | ICD-10-CM | POA: Diagnosis not present

## 2017-01-11 DIAGNOSIS — S46812D Strain of other muscles, fascia and tendons at shoulder and upper arm level, left arm, subsequent encounter: Secondary | ICD-10-CM | POA: Diagnosis not present

## 2017-01-18 ENCOUNTER — Ambulatory Visit: Payer: 59 | Admitting: Sports Medicine

## 2017-01-19 DIAGNOSIS — M25512 Pain in left shoulder: Secondary | ICD-10-CM | POA: Diagnosis not present

## 2017-01-19 DIAGNOSIS — S46812D Strain of other muscles, fascia and tendons at shoulder and upper arm level, left arm, subsequent encounter: Secondary | ICD-10-CM | POA: Diagnosis not present

## 2017-01-28 DIAGNOSIS — M25512 Pain in left shoulder: Secondary | ICD-10-CM | POA: Diagnosis not present

## 2017-01-28 DIAGNOSIS — S46812D Strain of other muscles, fascia and tendons at shoulder and upper arm level, left arm, subsequent encounter: Secondary | ICD-10-CM | POA: Diagnosis not present

## 2017-02-02 DIAGNOSIS — S46812D Strain of other muscles, fascia and tendons at shoulder and upper arm level, left arm, subsequent encounter: Secondary | ICD-10-CM | POA: Diagnosis not present

## 2017-02-02 DIAGNOSIS — M25512 Pain in left shoulder: Secondary | ICD-10-CM | POA: Diagnosis not present

## 2017-02-04 DIAGNOSIS — S46812D Strain of other muscles, fascia and tendons at shoulder and upper arm level, left arm, subsequent encounter: Secondary | ICD-10-CM | POA: Diagnosis not present

## 2017-02-04 DIAGNOSIS — M25512 Pain in left shoulder: Secondary | ICD-10-CM | POA: Diagnosis not present

## 2017-03-16 DIAGNOSIS — S46812D Strain of other muscles, fascia and tendons at shoulder and upper arm level, left arm, subsequent encounter: Secondary | ICD-10-CM | POA: Diagnosis not present

## 2017-03-16 DIAGNOSIS — M25512 Pain in left shoulder: Secondary | ICD-10-CM | POA: Diagnosis not present

## 2017-03-23 DIAGNOSIS — M25512 Pain in left shoulder: Secondary | ICD-10-CM | POA: Diagnosis not present

## 2017-03-23 DIAGNOSIS — S46812D Strain of other muscles, fascia and tendons at shoulder and upper arm level, left arm, subsequent encounter: Secondary | ICD-10-CM | POA: Diagnosis not present

## 2017-03-30 DIAGNOSIS — M25512 Pain in left shoulder: Secondary | ICD-10-CM | POA: Diagnosis not present

## 2017-03-30 DIAGNOSIS — S46812D Strain of other muscles, fascia and tendons at shoulder and upper arm level, left arm, subsequent encounter: Secondary | ICD-10-CM | POA: Diagnosis not present

## 2017-04-06 DIAGNOSIS — S46812D Strain of other muscles, fascia and tendons at shoulder and upper arm level, left arm, subsequent encounter: Secondary | ICD-10-CM | POA: Diagnosis not present

## 2017-04-06 DIAGNOSIS — M25512 Pain in left shoulder: Secondary | ICD-10-CM | POA: Diagnosis not present

## 2017-05-24 DIAGNOSIS — Z7721 Contact with and (suspected) exposure to potentially hazardous body fluids: Secondary | ICD-10-CM | POA: Diagnosis not present

## 2017-05-24 DIAGNOSIS — G479 Sleep disorder, unspecified: Secondary | ICD-10-CM | POA: Diagnosis not present

## 2017-05-24 DIAGNOSIS — L723 Sebaceous cyst: Secondary | ICD-10-CM | POA: Diagnosis not present

## 2017-06-13 DIAGNOSIS — Z131 Encounter for screening for diabetes mellitus: Secondary | ICD-10-CM | POA: Diagnosis not present

## 2017-06-13 DIAGNOSIS — Z0000 Encounter for general adult medical examination without abnormal findings: Secondary | ICD-10-CM | POA: Diagnosis not present

## 2017-08-22 DIAGNOSIS — K30 Functional dyspepsia: Secondary | ICD-10-CM | POA: Diagnosis not present

## 2018-01-20 DIAGNOSIS — L0293 Carbuncle, unspecified: Secondary | ICD-10-CM | POA: Diagnosis not present

## 2018-03-20 ENCOUNTER — Other Ambulatory Visit: Payer: Self-pay | Admitting: Physician Assistant

## 2018-03-20 ENCOUNTER — Ambulatory Visit
Admission: RE | Admit: 2018-03-20 | Discharge: 2018-03-20 | Disposition: A | Discharge status: Home / Self Care | Payer: 59 | Source: Ambulatory Visit | Attending: Physician Assistant | Admitting: Physician Assistant

## 2018-03-20 DIAGNOSIS — M25522 Pain in left elbow: Secondary | ICD-10-CM | POA: Diagnosis not present

## 2018-03-20 DIAGNOSIS — T1490XA Injury, unspecified, initial encounter: Secondary | ICD-10-CM

## 2018-03-20 DIAGNOSIS — J310 Chronic rhinitis: Secondary | ICD-10-CM | POA: Diagnosis not present

## 2018-03-20 DIAGNOSIS — D229 Melanocytic nevi, unspecified: Secondary | ICD-10-CM | POA: Diagnosis not present

## 2018-03-22 DIAGNOSIS — M7702 Medial epicondylitis, left elbow: Secondary | ICD-10-CM | POA: Diagnosis not present

## 2018-03-27 DIAGNOSIS — M7702 Medial epicondylitis, left elbow: Secondary | ICD-10-CM | POA: Diagnosis not present

## 2018-03-31 DIAGNOSIS — M7702 Medial epicondylitis, left elbow: Secondary | ICD-10-CM | POA: Diagnosis not present

## 2018-04-03 DIAGNOSIS — M7702 Medial epicondylitis, left elbow: Secondary | ICD-10-CM | POA: Diagnosis not present

## 2018-04-07 DIAGNOSIS — M7702 Medial epicondylitis, left elbow: Secondary | ICD-10-CM | POA: Diagnosis not present

## 2018-04-11 DIAGNOSIS — M7702 Medial epicondylitis, left elbow: Secondary | ICD-10-CM | POA: Diagnosis not present

## 2018-04-13 DIAGNOSIS — M546 Pain in thoracic spine: Secondary | ICD-10-CM | POA: Diagnosis not present

## 2018-05-22 ENCOUNTER — Other Ambulatory Visit: Payer: Self-pay | Admitting: Physician Assistant

## 2018-05-22 DIAGNOSIS — M25522 Pain in left elbow: Secondary | ICD-10-CM

## 2018-05-24 ENCOUNTER — Ambulatory Visit
Admission: RE | Admit: 2018-05-24 | Discharge: 2018-05-24 | Disposition: A | Discharge status: Home / Self Care | Payer: 59 | Source: Ambulatory Visit | Attending: Physician Assistant | Admitting: Physician Assistant

## 2018-05-24 DIAGNOSIS — M25522 Pain in left elbow: Secondary | ICD-10-CM

## 2018-05-24 DIAGNOSIS — S46211A Strain of muscle, fascia and tendon of other parts of biceps, right arm, initial encounter: Secondary | ICD-10-CM | POA: Diagnosis not present

## 2018-06-09 ENCOUNTER — Ambulatory Visit
Admission: RE | Admit: 2018-06-09 | Discharge: 2018-06-09 | Disposition: A | Discharge status: Home / Self Care | Payer: 59 | Source: Ambulatory Visit | Attending: Sports Medicine | Admitting: Sports Medicine

## 2018-06-09 ENCOUNTER — Encounter: Payer: Self-pay | Admitting: Sports Medicine

## 2018-06-09 ENCOUNTER — Other Ambulatory Visit: Payer: Self-pay | Admitting: Sports Medicine

## 2018-06-09 ENCOUNTER — Ambulatory Visit (INDEPENDENT_AMBULATORY_CARE_PROVIDER_SITE_OTHER): Payer: 59 | Admitting: Sports Medicine

## 2018-06-09 VITALS — BP 118/72 | Ht 72.0 in | Wt 183.0 lb

## 2018-06-09 DIAGNOSIS — M7702 Medial epicondylitis, left elbow: Secondary | ICD-10-CM | POA: Diagnosis not present

## 2018-06-09 DIAGNOSIS — M546 Pain in thoracic spine: Secondary | ICD-10-CM | POA: Diagnosis not present

## 2018-06-09 DIAGNOSIS — M549 Dorsalgia, unspecified: Secondary | ICD-10-CM | POA: Diagnosis not present

## 2018-06-09 MED ORDER — CYCLOBENZAPRINE HCL 10 MG PO TABS: 10.0000 mg | ORAL_TABLET | Freq: Every evening | ORAL | 0 refills | Status: DC | PRN

## 2018-06-09 MED ORDER — MELOXICAM 15 MG PO TABS: 15.0000 mg | ORAL_TABLET | Freq: Every day | ORAL | 1 refills | Status: DC

## 2018-06-09 NOTE — Progress Notes (Addendum)
HPI  CC: 3 left medial elbow pain  Jordan [name] a 28 year old male who presents for left medial elbow pain.  He states he has had this elbow pain for 3 and half months.  He was seen by his primary care doctor, who obtained an MRI.  The MRI showed a small interstitial tear of the flexor bundle on the left medial epicondyle.  He was in PT around 2 months ago.  He states this did help with the pain significantly.  He went back to full workouts, which he states made the pain significantly worse.  He has not been to physical therapy for the last 2 months.  He has been taking ibuprofen as needed.  He states is helped somewhat.  He was also on a small steroid dose 2 months ago prescribed by his PCP.  He denies any numbness and tingling his hand.  Denies any weakness of his hand.  He states the back pain is made worse with any flexion of his wrist.  He also reports mid back pain.  He states is been present for the last 6 weeks.  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  He states the pain is made worse when he does any kind of lifting.  He states is also made worse with any kind of side-to-side motion.  The pain is improved with rest.  He has been taking ibuprofen daily with some relief.  He denies any numbness tingling she down his legs.  Denies any weakness of his lower extremity.  He denies any balance issues.  See HPI and/or previous note for associated ROS.  Objective: BP 118/72   Ht 6' (1.829 m)   Wt 183 lb (83 kg)   BMI 24.82 kg/m  Gen: Right-Hand Dominant. NAD, well groomed, a/o x3, normal affect.  CV: Well-perfused. Warm.  Resp: Non-labored.  Neuro: Sensation intact throughout. No gross coordination deficits.  Gait: Nonpathologic posture, unremarkable stride without signs of limp or balance issues.  Left elbow exam: No erythema, warmth, swelling noted.  Tenderness palpation over the medial epicondyles.  Full range of motion in flexion and extension of the  elbow.  Full range of motion of flexion-extension of the wrist.  Pain with flexion of the wrist.  Strength 5 out of 5 throughout testing.  Negative UCL milk test.  Back exam: No erythema, warmth, swelling noted.  Tenderness palpation over the right paraspinal muscles around T2.  Full range of motion in flexion extension of the spine.  Full range of motion cytocide motion.  No pain with range of motion.  Strength out of 5 throughout lower extremity testing.  Negative straight leg raise bilaterally.  Negative stork test bilaterally.  Assessment and plan: 1.  Medial epicondylitis, with interstitial tear seen on MRI performed December 2019. 2.  Mechanical mid back pain  We discussed treatment options at today's visit.  We will start him back in physical therapy at this time for medial epicondylitis with a small interstitial tear.  We will also continue PT for his mid back pain.  I have started meloxicam 50 mg daily at this time.  Also given Flexeril at night for his back pain.  Will obtain x-ray 2 view of the T-spine to rule out any kind of acute fracture.  We will see back in 6 weeks in follow-up.  I will consider a surgical referral for medial epicondylitis in 6 weeks if he has no improvement.  Jordan Rife, MD  [facility] 06/09/2018 1:10 PM   Patient seen and evaluated with the sports medicine fellow.  I agree with the above plan of care.  We will check x-rays of his thoracic spine.  Call the patient with those results.  Patient will start physical therapy with Barbaraann Barthel for both his thoracic spine and his medial epicondylitis.  He needs to modify his workouts, primarily avoiding biceps and volar forearm work.  Meloxicam during the day.  Flexeril at night as needed for pain and spasm in the back.  Follow-up in 6 weeks for reevaluation.  Call with questions or concerns in the interim.  Addendum: Thoracic spine x-ray is unremarkable.  Patient notified of results.

## 2018-06-14 DIAGNOSIS — M7702 Medial epicondylitis, left elbow: Secondary | ICD-10-CM | POA: Diagnosis not present

## 2018-06-14 DIAGNOSIS — M546 Pain in thoracic spine: Secondary | ICD-10-CM | POA: Diagnosis not present

## 2018-06-20 DIAGNOSIS — M546 Pain in thoracic spine: Secondary | ICD-10-CM | POA: Diagnosis not present

## 2018-06-20 DIAGNOSIS — M7702 Medial epicondylitis, left elbow: Secondary | ICD-10-CM | POA: Diagnosis not present

## 2018-06-26 DIAGNOSIS — M546 Pain in thoracic spine: Secondary | ICD-10-CM | POA: Diagnosis not present

## 2018-06-26 DIAGNOSIS — M7702 Medial epicondylitis, left elbow: Secondary | ICD-10-CM | POA: Diagnosis not present

## 2018-06-30 DIAGNOSIS — M546 Pain in thoracic spine: Secondary | ICD-10-CM | POA: Diagnosis not present

## 2018-06-30 DIAGNOSIS — M7702 Medial epicondylitis, left elbow: Secondary | ICD-10-CM | POA: Diagnosis not present

## 2018-07-03 DIAGNOSIS — M7702 Medial epicondylitis, left elbow: Secondary | ICD-10-CM | POA: Diagnosis not present

## 2018-07-03 DIAGNOSIS — M546 Pain in thoracic spine: Secondary | ICD-10-CM | POA: Diagnosis not present

## 2018-07-07 DIAGNOSIS — M7702 Medial epicondylitis, left elbow: Secondary | ICD-10-CM | POA: Diagnosis not present

## 2018-07-07 DIAGNOSIS — M546 Pain in thoracic spine: Secondary | ICD-10-CM | POA: Diagnosis not present

## 2018-07-10 DIAGNOSIS — M546 Pain in thoracic spine: Secondary | ICD-10-CM | POA: Diagnosis not present

## 2018-07-10 DIAGNOSIS — M7702 Medial epicondylitis, left elbow: Secondary | ICD-10-CM | POA: Diagnosis not present

## 2018-07-18 DIAGNOSIS — J101 Influenza due to other identified influenza virus with other respiratory manifestations: Secondary | ICD-10-CM | POA: Diagnosis not present

## 2018-07-18 DIAGNOSIS — R509 Fever, unspecified: Secondary | ICD-10-CM | POA: Diagnosis not present

## 2018-07-18 DIAGNOSIS — M546 Pain in thoracic spine: Secondary | ICD-10-CM | POA: Diagnosis not present

## 2018-07-21 DIAGNOSIS — M7702 Medial epicondylitis, left elbow: Secondary | ICD-10-CM | POA: Diagnosis not present

## 2018-07-21 DIAGNOSIS — M546 Pain in thoracic spine: Secondary | ICD-10-CM | POA: Diagnosis not present

## 2018-07-24 ENCOUNTER — Encounter: Payer: Self-pay | Admitting: Sports Medicine

## 2018-07-24 ENCOUNTER — Ambulatory Visit (INDEPENDENT_AMBULATORY_CARE_PROVIDER_SITE_OTHER): Payer: 59 | Admitting: Sports Medicine

## 2018-07-24 VITALS — BP 124/68 | Ht 72.0 in | Wt 183.0 lb

## 2018-07-24 DIAGNOSIS — M549 Dorsalgia, unspecified: Secondary | ICD-10-CM | POA: Diagnosis not present

## 2018-07-24 DIAGNOSIS — M7702 Medial epicondylitis, left elbow: Secondary | ICD-10-CM | POA: Diagnosis not present

## 2018-07-24 DIAGNOSIS — M25511 Pain in right shoulder: Secondary | ICD-10-CM | POA: Diagnosis not present

## 2018-07-24 DIAGNOSIS — M546 Pain in thoracic spine: Secondary | ICD-10-CM | POA: Diagnosis not present

## 2018-07-24 MED ORDER — PREDNISONE 10 MG PO TABS: ORAL_TABLET | ORAL | 0 refills | Status: DC

## 2018-07-24 MED ORDER — CYCLOBENZAPRINE HCL 10 MG PO TABS: 10.0000 mg | ORAL_TABLET | Freq: Every evening | ORAL | 0 refills | Status: DC | PRN

## 2018-07-24 NOTE — Progress Notes (Signed)
Subjective:     Patient ID: Jordan [name], male   DOB: 05-27-1991, 28 y.o.   MRN: [id number]  HPI Jordan [name] is a 28yo with PMHx of scoliosis who is presenting for follow-up of his L medial epicondylitis and mid-thoracic back pain, as well as for evaluation of R "scapular" pain. His L elbow pain is slightly better since his last visit, having improved significantly with PT and stretching, but then gotten worse again with heat therapy. He has now been focusing on icing the elbow with a large ice pack to try to ease his symptoms. He does feel weak in that arm in particular with grip strength, though when his strength was formally tested it was largely identical to the R. It is unclear whether this pain was affected at all by the flexeril and meloxicam.  His mid-back pain is significantly improved after using muscle relaxer and NSAIDs. Now he says he feels a little "mass" on the R side of his mid-thoracic back that does not hurt when pressed, but he notices it when he massages his paraspinal area with a tennis ball. This mass moves and feels like "a muscle knot."  His scapular pain he says began around the same time as his back pain, but initially was not significant enough to discuss at his visit. Now he reports that this is the most significant of his symptoms. He says the pain moves along the medial border of his scapula, and is aggravated by face-pulls (rhomboid flexion) and lateral lifts (shoulder abduction) with weights. He does not feel clicking or instability, nor does he feel weakness of the shoulder. He says he had this same pain in his L shoulder initially, but that it went away with a course of steroids prior to his initial presentation here, and then reappeared on his R. This pain was not helped by meloxicam or flexeril, but it has improved with rest.   Review of Systems As above.    Objective:   Physical Exam  L elbow: no swelling, ecchymoses, erythema, or warmth; tenderness to palpation over  the medial condyle, no tenderness to palpation elsewhere in the joint; full ROM with extension and flexion without pain; full strength with extension, flexion, pronation, and supination without pain; no pain with ECRB stress, no pain with resisted flexion of wrist  R elbow: no swelling, ecchymoses, erythema, or warmth; no tenderness to palpation over the medial condyle or elsewhere in the joint; full ROM with extension and flexion without pain; full strength with extension, flexion, pronation, and supination without pain; no pain with ECRB stress, no pain with resisted flexion of wrist  Back: notable scoliosis with hypertrophy of R paraspinal muscles of the mid-thoracic area; no tenderness to palpation of the paraspinal muscles, unable to palpate a mass in the R paraspinal mid-thoracic area; full ROM with flexion and extension of the back; negative stork test, negative straight leg raise bilaterally  R shoulder: no effusion, ecchymoses, erythema, or warmth; no tenderness to palpation over the clavicle, AC joint, bicipital groove, or scapula; full ROM including abduction, flexion, extension, internal rotation, and external rotation without pain; full strength of abduction, flexion, extension, internal and external rotation; negative Neer's, Hawkins, Empty Can; no evidence of scapular dyskinesis  L shoulder: no effusion, ecchymoses, erythema, or warmth; no tenderness to palpation over the clavicle, AC joint, bicipital groove, or scapula; full ROM including abduction, flexion, extension, internal rotation, and external rotation without pain; full strength of abduction, flexion, extension, internal and external rotation; negative  Neer's, Hawkins, Empty Can; no evidence of scapular dyskinesis    Assessment:     L elbow medial epicondylitis: some improvement, will likely take some more time with rest and proper icing to improve. Instructed on icing technique with massage as opposed to large-area cooling which  can affect the ulnar nerve. Instructed on proper placement of the elbow strap. If no improvement in a few more weeks, he can get back in touch for PRP referral.  Mid-thoracic back pain: Resolved with medications and stretching.  R scapular pain: unclear etiology, most likely some element of bursitis given the "knot" he feels in his back, and could have an element of muscular strain that originates from his scoliosis. Will try a 6-day steroid treatment along with flexeril refill to target this pain. Instructed him to keep refraining from exercises that aggravate his pain, and since he wants to be building his shoulder muscles currently, offered him military presses and anterior lifts in the meantime. Will see back in 6 weeks to check back in.    Plan:     - Ice massage, elbow strap, and PT exercises for L elbow - Can consider PRP injection of L elbow if no improvement - Refill flexeril - 6-day prednisone treatment for R scapular pain  Jordan [name], MS4    Patient seen and evaluated with the medical student.  I agree with the above plan of care.  Left elbow medial epicondylitis is improving slowly.  We did discuss referral for PRP to Dr.T if symptoms do not resolve.  Treatment as above for his right scapular pain.  I will put him on a 6-day Sterapred Dosepak and refill his Flexeril.  Follow-up with me in 6 weeks for reevaluation.

## 2018-07-28 DIAGNOSIS — M546 Pain in thoracic spine: Secondary | ICD-10-CM | POA: Diagnosis not present

## 2018-07-28 DIAGNOSIS — M7702 Medial epicondylitis, left elbow: Secondary | ICD-10-CM | POA: Diagnosis not present

## 2018-07-31 DIAGNOSIS — M7702 Medial epicondylitis, left elbow: Secondary | ICD-10-CM | POA: Diagnosis not present

## 2018-07-31 DIAGNOSIS — M546 Pain in thoracic spine: Secondary | ICD-10-CM | POA: Diagnosis not present

## 2018-08-04 DIAGNOSIS — M546 Pain in thoracic spine: Secondary | ICD-10-CM | POA: Diagnosis not present

## 2018-08-04 DIAGNOSIS — M7702 Medial epicondylitis, left elbow: Secondary | ICD-10-CM | POA: Diagnosis not present

## 2018-08-11 DIAGNOSIS — M546 Pain in thoracic spine: Secondary | ICD-10-CM | POA: Diagnosis not present

## 2018-08-11 DIAGNOSIS — M7702 Medial epicondylitis, left elbow: Secondary | ICD-10-CM | POA: Diagnosis not present

## 2018-08-15 ENCOUNTER — Ambulatory Visit (INDEPENDENT_AMBULATORY_CARE_PROVIDER_SITE_OTHER): Payer: 59 | Admitting: Sports Medicine

## 2018-08-15 ENCOUNTER — Ambulatory Visit: Payer: Self-pay

## 2018-08-15 VITALS — BP 121/71 | Ht 72.0 in | Wt 185.0 lb

## 2018-08-15 DIAGNOSIS — M7702 Medial epicondylitis, left elbow: Secondary | ICD-10-CM

## 2018-08-15 NOTE — Progress Notes (Signed)
   HPI  CC: Left elbow pain  Baylee a 28 year old male presents for follow-up of left elbow pain.  He was last seen on 07/24/2018.  He was given exercises to work on for home therapy.  Also given meloxicam.  He is also been in physical therapy since that time.  He states that this thing seem to help with his pain.  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  He denies any numbness and tingling in his hand.  The pain is focal to the medial epicondyle of the elbow.  He also reports back pain that continues to persist.  States the pain is in the middle of his back.  Is on the right side, at the insertion of the trap.  He states the pain is worse when he does any exercise that engaged the trap muscle.  He has been using a ball to roll the area, which he states is painful but does help.  See HPI and/or previous note for associated ROS.  Objective: BP 121/71   Ht 6' (1.829 m)   Wt 185 lb (83.9 kg)   BMI 25.09 kg/m  Gen: Right-Hand Dominant. NAD, well groomed, a/o x3, normal affect.  CV: Well-perfused. Warm.  Resp: Non-labored.  Neuro: Sensation intact throughout. No gross coordination deficits.  Gait: Nonpathologic posture, unremarkable stride without signs of limp or balance issues.  Left elbow exam: No erythema, warmth, swelling noted.  Tenderness palpation over the medial epicondyle.  Full range of motion in the elbow.  Negative UCL milk test.  Back exam: No erythema, warmth, swelling noted.  Tenderness palpation noted focally at the midportion of the inferior insertion of the right trapezius muscle.  ULTRASOUND: Elbow, left Diagnostic limited ultrasound imaging obtained of patient's left shoulder.  -Medial epicondyle visualized, with hypoechoic changes noted at the insertion point of the common flexor tendon on the medial epicondyle.  There are also calcifications noted at the deep aspect of this attachment.  Partial tear visualized,  fluid noted as well. IMPRESSION: findings consistent with medial epicondylitis.   Assessment and plan: 1.  Left-sided medial epicondylitis 2.  Trigger point of the right trapezius muscle, inferior border.  We discussed treatment options at today's visit.  I did ultrasound as noted above.  This confirms what the MRI showed [place] from December 2019, that he has medial epicondylitis.  There is no to be interstitial tear on MRI as well.  This is also confirmed today on ultrasound.  We will refer him to Dr. Darene Lamer at today's visit for consideration of PRP injections.  We also discussed treatment of trigger points.  This includes continue with physical therapy, and considering doing dry needling and rolling exercises.  We will see him back for follow-up after the PRP injections.  [name], MD [facility] Fellow 08/15/2018 5:13 PM  Patient seen and evaluated with the sports medicine fellow.  I agree with the above plan of care.  We will refer the patient to Dr. Darene Lamer for possible PRP injection into the common flexor tendon.  Follow-up with me as needed.

## 2018-08-15 NOTE — Patient Instructions (Signed)
Thank you for coming see [place] today in clinic.  You were seen today for evaluation of your left elbow and mid back.  We will refer you to Dr.Thekkekandam for PRP injections.  His information is listed below.  Please call and schedule an appointment at your convenience.  Dr. Tereasa Coop [facility] [address] [place], [place], [place] [address] [phone]

## 2018-08-16 ENCOUNTER — Encounter: Payer: Self-pay | Admitting: Sports Medicine

## 2018-08-18 DIAGNOSIS — M7702 Medial epicondylitis, left elbow: Secondary | ICD-10-CM | POA: Diagnosis not present

## 2018-08-18 DIAGNOSIS — M546 Pain in thoracic spine: Secondary | ICD-10-CM | POA: Diagnosis not present

## 2018-08-25 ENCOUNTER — Ambulatory Visit (INDEPENDENT_AMBULATORY_CARE_PROVIDER_SITE_OTHER): Payer: 59 | Admitting: Sports Medicine

## 2018-08-25 ENCOUNTER — Other Ambulatory Visit: Payer: Self-pay

## 2018-08-25 DIAGNOSIS — M7702 Medial epicondylitis, left elbow: Secondary | ICD-10-CM

## 2018-08-25 MED ORDER — HYDROCODONE-ACETAMINOPHEN 10-325 MG PO TABS: 1.0000 | ORAL_TABLET | Freq: Three times a day (TID) | ORAL | 0 refills | Status: DC | PRN

## 2018-08-25 NOTE — Assessment & Plan Note (Addendum)
Refractory left medial epicondylitis. Failed activity modification, NSAIDs, therapy. Diagnosis confirmed on MRI. Decision made to perform percutaneous tenotomy with platelet rich plasma today. High-dose hydrocodone for postoperative pain. Avoid NSAIDs for now. Strapped with compressive dressing. Return to see me in 2 weeks to review reevaluate and we will also start formal physical therapy at that time, 2 weeks of gentle range of motion, 2 weeks of active range of motion, 2 to 4 weeks of strengthening, all eccentric.

## 2018-08-25 NOTE — Progress Notes (Signed)
Subjective:    I'm seeing this patient as a consultation for: Dr. Yvetta Coder, Dr. [name].  CC: Left elbow pain  HPI: This is a very pleasant 28 year old male, he has had at least 6 months of pain at the medial epicondyle, moderate, persistent, localized without radiation.  Worse with gripping.  Initially he had paresthesias going down and ulnar nerve distribution.  This has since resolved and has not recurred.  At this point he cannot lift weights, and has pain with almost every activity.  He has had physical therapy, activity modification, NSAIDs, no steroid injections.  Symptoms are severe, persistent, localized without radiation.  He is referred to me for further evaluation and definitive treatment as well as consideration of platelet rich plasma percutaneous tenotomy.  I reviewed the past medical history, family history, social history, surgical history, and allergies today and no changes were needed.  Please see the problem list section below in epic for further details.  Past Medical History: Past Medical History:  Diagnosis Date  . Pneumothorax    Past Surgical History: No past surgical history on file. Social History: Social History   Socioeconomic History  . Marital status: Married    Spouse name: Not on file  . Number of children: Not on file  . Years of education: Not on file  . Highest education level: Not on file  Occupational History  . Not on file  Social Needs  . Financial resource strain: Not on file  . Food insecurity:    Worry: Not on file    Inability: Not on file  . Transportation needs:    Medical: Not on file    Non-medical: Not on file  Tobacco Use  . Smoking status: Never Smoker  . Smokeless tobacco: Never Used  Substance and Sexual Activity  . Alcohol use: No  . Drug use: No  . Sexual activity: Not on file  Lifestyle  . Physical activity:    Days per week: Not on file    Minutes per session: Not on file  . Stress: Not on file   Relationships  . Social connections:    Talks on phone: Not on file    Gets together: Not on file    Attends religious service: Not on file    Active member of club or organization: Not on file    Attends meetings of clubs or organizations: Not on file    Relationship status: Not on file  Other Topics Concern  . Not on file  Social History Narrative  . Not on file   Family History: Family History  Problem Relation Age of Onset  . Breast cancer Mother   . Prostate cancer Father   . Lung cancer Paternal Grandfather   . Bladder Cancer Father   . Skin cancer Father    Allergies: No Known Allergies Medications: See med rec.  Review of Systems: No headache, visual changes, nausea, vomiting, diarrhea, constipation, dizziness, abdominal pain, skin rash, fevers, chills, night sweats, weight loss, swollen lymph nodes, body aches, joint swelling, muscle aches, chest pain, shortness of breath, mood changes, visual or auditory hallucinations.   Objective:   General: Well Developed, well nourished, and in no acute distress.  Neuro:  Extra-ocular muscles intact, able to move all 4 extremities, sensation grossly intact.  Deep tendon reflexes tested were normal. Psych: Alert and oriented, mood congruent with affect. ENT:  Ears and nose appear unremarkable.  Hearing grossly normal. Neck: Unremarkable overall appearance, trachea midline.  No visible thyroid enlargement. Eyes: Conjunctivae and lids appear unremarkable.  Pupils equal and round. Skin: Warm and dry, no rashes noted.  Cardiovascular: Pulses palpable, no extremity edema. Left elbow: Unremarkable to inspection. Range of motion full pronation, supination, flexion, extension. Strength is full to all of the above directions Stable to varus, valgus stress. Negative moving valgus stress test. Very tender to palpation at the common flexor tendon origin. Ulnar nerve does not sublux. Negative cubital tunnel Tinel's.  I personally  reviewed the elbow MRI, there is increased T2 signal as well as hypoechoic area at the deep common flexor tendon origin.  Procedure: Real-time Ultrasound Guided Platelet Rich Plasma (PRP) percutaneous tenotomy of left common flexor tendon origin. Device: GE Logiq E  Verbal informed consent obtained.  Time-out conducted.  Noted no overlying erythema, induration, or other signs of local infection.  Obtained 30 cc of blood from peripheral vein, using the "PEAK" centrifuge, red blood cells were separated from the plasma. Subsequently red blood cells were drained leaving only plasma with the buffy coat layer between the desired lines. Platelet poor plasma was then centrifuged out, and remaining platelet rich plasma aspirated into a 5 cc syringe.  Skin prepped in a sterile fashion.  Local anesthesia: Topical Ethyl chloride.  With sterile technique and under real time ultrasound guidance the platelet rich plasma (PRP) obtained above: Using a 22-gauge needle I injected 3 cc lidocaine, 3 cc bupivacaine superficial to and deep to common flexor tendon at the medial epicondyle, I then switched syringes and then made 50-100 passes through the common flexor tendon performing a percutaneous tenotomy and delivering platelet rich plasma through the tendon itself.  I performed an extensive peppering of the periosteum of the bone across the broad origin of the tendon itself. Completed without difficulty  Advised to call if fevers/chills, erythema, induration, drainage, or persistent bleeding.  Images permanently stored and available for review in the ultrasound unit.  Impression: Technically successful ultrasound guided Platelet Rich Plasma (PRP) percutaneous tenotomy.  Impression and Recommendations:   This case required medical decision making of moderate complexity.  Medial epicondylitis, left Refractory left medial epicondylitis. Failed activity modification, NSAIDs, therapy. Diagnosis confirmed on MRI.  Decision made to perform percutaneous tenotomy with platelet rich plasma today. High-dose hydrocodone for postoperative pain. Avoid NSAIDs for now. Strapped with compressive dressing. Return to see me in 2 weeks to review reevaluate and we will also start formal physical therapy at that time, 2 weeks of gentle range of motion, 2 weeks of active range of motion, 2 to 4 weeks of strengthening, all eccentric.   ___________________________________________ Gwen Her. Dianah Field, M.D., ABFM., CAQSM. Primary Care and Sports Medicine [facility]  Adjunct Professor of [employer]

## 2018-08-28 ENCOUNTER — Telehealth: Payer: Self-pay | Admitting: Sports Medicine

## 2018-08-28 ENCOUNTER — Ambulatory Visit: Payer: Self-pay | Admitting: Sports Medicine

## 2018-08-28 NOTE — Telephone Encounter (Signed)
Received a fax from insurance that no PA was needed for this medication. Patient can get another fill on or after 09/01/2018. Pharmacy aware and form sent to scan.

## 2018-08-28 NOTE — Telephone Encounter (Signed)
Received a VM that was left late Friday afternoon that patient was needing an urgent PA for Norco. I called the pharmacy this morning and per pharmacy tech at [facility] the patient was given a week of the medication and the remaining prescription was discarded at the pharmacy. I have left a brief VM that a PA was in process for the medication and to call the office back with any questions.

## 2018-08-29 ENCOUNTER — Telehealth: Payer: Self-pay | Admitting: *Deleted

## 2018-08-29 NOTE — Telephone Encounter (Signed)
Done

## 2018-08-29 NOTE — Telephone Encounter (Signed)
Pt left vm stating that he needs a note for work from last Friday and returning tomorrow.

## 2018-08-29 NOTE — Telephone Encounter (Signed)
Pt.notified

## 2018-09-08 ENCOUNTER — Ambulatory Visit (INDEPENDENT_AMBULATORY_CARE_PROVIDER_SITE_OTHER): Payer: 59 | Admitting: Sports Medicine

## 2018-09-08 ENCOUNTER — Ambulatory Visit: Payer: Self-pay | Admitting: Sports Medicine

## 2018-09-08 ENCOUNTER — Encounter: Payer: Self-pay | Admitting: Sports Medicine

## 2018-09-08 DIAGNOSIS — M7702 Medial epicondylitis, left elbow: Secondary | ICD-10-CM

## 2018-09-08 NOTE — Progress Notes (Signed)
Virtual Visit via WebEx   I connected with  Jordan [name]  on 09/08/18 via WebEx and verified that I am speaking with the correct person using two identifiers.   I discussed the limitations, risks, security and privacy concerns of performing an evaluation and management service by WebEx, including the higher likelihood of inaccurate diagnosis and treatment, and the availability of in person appointments.  We also discussed the likely need of an additional face to face encounter for complete and high quality delivery of care.  I also discussed with the patient that there may be a patient responsible charge related to this service. The patient expressed understanding and wishes to proceed.  Subjective:    CC: Follow-up  HPI: 2 weeks ago I did a aggressive percutaneous tenotomy with platelet rich plasma of Jordan [name]'s left common flexor tendon origin, he is doing significantly better, really does not have much swelling, happy with how things are going.  I reviewed the past medical history, family history, social history, surgical history, and allergies today and no changes were needed.  Please see the problem list section below in epic for further details.  Past Medical History: Past Medical History:  Diagnosis Date  . Pneumothorax    Past Surgical History: No past surgical history on file. Social History: Social History   Socioeconomic History  . Marital status: Married    Spouse name: Not on file  . Number of children: Not on file  . Years of education: Not on file  . Highest education level: Not on file  Occupational History  . Not on file  Social Needs  . Financial resource strain: Not on file  . Food insecurity:    Worry: Not on file    Inability: Not on file  . Transportation needs:    Medical: Not on file    Non-medical: Not on file  Tobacco Use  . Smoking status: Never Smoker  . Smokeless tobacco: Never Used  Substance and Sexual Activity  . Alcohol use: No  . Drug  use: No  . Sexual activity: Not on file  Lifestyle  . Physical activity:    Days per week: Not on file    Minutes per session: Not on file  . Stress: Not on file  Relationships  . Social connections:    Talks on phone: Not on file    Gets together: Not on file    Attends religious service: Not on file    Active member of club or organization: Not on file    Attends meetings of clubs or organizations: Not on file    Relationship status: Not on file  Other Topics Concern  . Not on file  Social History Narrative  . Not on file   Family History: Family History  Problem Relation Age of Onset  . Breast cancer Mother   . Prostate cancer Father   . Lung cancer Paternal Grandfather   . Bladder Cancer Father   . Skin cancer Father    Allergies: No Known Allergies Medications: See med rec.  Review of Systems: No fevers, chills, night sweats, weight loss, chest pain, or shortness of breath.   Objective:    General: Speaking full sentences, no audible heavy breathing.  Sounds alert and appropriately interactive.  Appears well.  Face symmetric.  Extraocular movements intact.  Pupils equal and round.  No nasal flaring or accessory muscle use visualized.  No other physical exam performed due to the non-physical nature of this visit.  Impression and Recommendations:    Medial epicondylitis, left 2 weeks post percutaneous tenotomy with platelet rich plasma. Overall doing well, swelling is resolving, pain is near preprocedure level at this point. At this point we are continuing to avoid NSAIDs. We can start physical therapy, 1 week of gentle passive range of motion, 1 week of eccentric active range of motion, and then 2 to 6 weeks of aggressive eccentric strengthening.  I discussed the above assessment and treatment plan with the patient. The patient was provided an opportunity to ask questions and all were answered. The patient agreed with the plan and demonstrated an understanding of  the instructions.   The patient was advised to call back or seek an in-person evaluation if the symptoms worsen or if the condition fails to improve as anticipated.   I provided 20 minutes of electronic video evaluation time during this encounter, less than 50% was time needed to gather information, review chart and records, explain the treatment plan to the patient, and complete documentation.   ___________________________________________ Gwen Her. Dianah Field, M.D., ABFM., CAQSM. Primary Care and Sports Medicine [facility]  Adjunct Professor of [employer]

## 2018-09-08 NOTE — Assessment & Plan Note (Signed)
2 weeks post percutaneous tenotomy with platelet rich plasma. Overall doing well, swelling is resolving, pain is near preprocedure level at this point. At this point we are continuing to avoid NSAIDs. We can start physical therapy, 1 week of gentle passive range of motion, 1 week of eccentric active range of motion, and then 2 to 6 weeks of aggressive eccentric strengthening.

## 2018-09-14 DIAGNOSIS — M25522 Pain in left elbow: Secondary | ICD-10-CM | POA: Diagnosis not present

## 2018-09-14 DIAGNOSIS — M6281 Muscle weakness (generalized): Secondary | ICD-10-CM | POA: Diagnosis not present

## 2018-09-18 DIAGNOSIS — M6281 Muscle weakness (generalized): Secondary | ICD-10-CM | POA: Diagnosis not present

## 2018-09-18 DIAGNOSIS — M25522 Pain in left elbow: Secondary | ICD-10-CM | POA: Diagnosis not present

## 2018-09-21 DIAGNOSIS — D225 Melanocytic nevi of trunk: Secondary | ICD-10-CM | POA: Diagnosis not present

## 2018-09-21 DIAGNOSIS — H6091 Unspecified otitis externa, right ear: Secondary | ICD-10-CM | POA: Diagnosis not present

## 2018-09-21 DIAGNOSIS — M6281 Muscle weakness (generalized): Secondary | ICD-10-CM | POA: Diagnosis not present

## 2018-09-21 DIAGNOSIS — D171 Benign lipomatous neoplasm of skin and subcutaneous tissue of trunk: Secondary | ICD-10-CM | POA: Diagnosis not present

## 2018-09-21 DIAGNOSIS — M25522 Pain in left elbow: Secondary | ICD-10-CM | POA: Diagnosis not present

## 2018-09-25 ENCOUNTER — Telehealth: Payer: Self-pay | Admitting: *Deleted

## 2018-09-25 DIAGNOSIS — M25522 Pain in left elbow: Secondary | ICD-10-CM | POA: Diagnosis not present

## 2018-09-25 DIAGNOSIS — R002 Palpitations: Secondary | ICD-10-CM | POA: Diagnosis not present

## 2018-09-25 DIAGNOSIS — M6281 Muscle weakness (generalized): Secondary | ICD-10-CM | POA: Diagnosis not present

## 2018-09-25 NOTE — Telephone Encounter (Signed)
I think we should wait until the 8-week point after the procedure.  That would be next month on the 20th, does he need something else to control pain in the meantime, we can discontinue the high-dose hydrocodone and try a milder analgesic such as tramadol.

## 2018-09-25 NOTE — Telephone Encounter (Signed)
Pt left vm wanting to know when he can start taking anti-inflammatories prn since having the PRP.  Please advise.

## 2018-09-26 NOTE — Telephone Encounter (Signed)
LMOM notifying pt of recommendations and requested a return call regarding Tramadol.

## 2018-09-27 ENCOUNTER — Other Ambulatory Visit: Payer: Self-pay

## 2018-09-27 ENCOUNTER — Emergency Department (HOSPITAL_COMMUNITY)
Admission: EM | Admit: 2018-09-27 | Discharge: 2018-09-27 | Disposition: A | Discharge status: Home / Self Care | Payer: 59 | Attending: Emergency Medicine | Admitting: Emergency Medicine

## 2018-09-27 ENCOUNTER — Emergency Department (HOSPITAL_COMMUNITY): Payer: 59

## 2018-09-27 ENCOUNTER — Encounter (HOSPITAL_COMMUNITY): Payer: Self-pay

## 2018-09-27 DIAGNOSIS — Z79899 Other long term (current) drug therapy: Secondary | ICD-10-CM | POA: Insufficient documentation

## 2018-09-27 DIAGNOSIS — H6590 Unspecified nonsuppurative otitis media, unspecified ear: Secondary | ICD-10-CM | POA: Diagnosis not present

## 2018-09-27 DIAGNOSIS — R002 Palpitations: Secondary | ICD-10-CM | POA: Insufficient documentation

## 2018-09-27 DIAGNOSIS — J309 Allergic rhinitis, unspecified: Secondary | ICD-10-CM | POA: Diagnosis not present

## 2018-09-27 LAB — URINALYSIS, ROUTINE W REFLEX MICROSCOPIC
Bacteria, UA: NONE SEEN
Bilirubin Urine: NEGATIVE
Glucose, UA: NEGATIVE mg/dL
Ketones, ur: NEGATIVE mg/dL
Leukocytes,Ua: NEGATIVE
Nitrite: NEGATIVE
Protein, ur: NEGATIVE mg/dL
Specific Gravity, Urine: 1.009 (ref 1.005–1.030)
pH: 7 (ref 5.0–8.0)

## 2018-09-27 LAB — CBC
HCT: 44 % (ref 39.0–52.0)
Hemoglobin: 14.3 g/dL (ref 13.0–17.0)
MCH: 29.5 pg (ref 26.0–34.0)
MCHC: 32.5 g/dL (ref 30.0–36.0)
MCV: 90.9 fL (ref 80.0–100.0)
Platelets: 224 10*3/uL (ref 150–400)
RBC: 4.84 MIL/uL (ref 4.22–5.81)
RDW: 12.3 % (ref 11.5–15.5)
WBC: 8.1 10*3/uL (ref 4.0–10.5)
nRBC: 0 % (ref 0.0–0.2)

## 2018-09-27 LAB — BASIC METABOLIC PANEL
Anion gap: 12 (ref 5–15)
BUN: 15 mg/dL (ref 6–20)
CO2: 26 mmol/L (ref 22–32)
Calcium: 10 mg/dL (ref 8.9–10.3)
Chloride: 102 mmol/L (ref 98–111)
Creatinine, Ser: 1.1 mg/dL (ref 0.61–1.24)
GFR calc Af Amer: >60 mL/min (ref >60)
GFR calc non Af Amer: >60 mL/min (ref >60)
Glucose, Bld: 97 mg/dL (ref 70–99)
Potassium: 3.6 mmol/L (ref 3.5–5.1)
Sodium: 140 mmol/L (ref 135–145)

## 2018-09-27 LAB — TSH: TSH: 2.145 u[IU]/mL (ref 0.350–4.500)

## 2018-09-27 LAB — TROPONIN I: Troponin I: <0.03 ng/mL (ref <0.03)

## 2018-09-27 NOTE — ED Provider Notes (Signed)
[facility] EMERGENCY DEPARTMENT Provider Note   CSN: [id number] Arrival date & time: 09/27/18  5361    History   Chief Complaint Chief Complaint  Patient presents with  . Palpitations    HPI ARCH METHOT is a 28 y.o. [name].     28 year old [name] with no significant past medical history presents with complaint of palpitations.  Patient states Jordan [name] has had 3 episodes in the past 4 days, woke up this morning feeling like his heart was racing.  Patient states episodes last about 10 minutes, denies feeling short of breath, chest pain, diaphoretic or any other complaints with the episodes. Patient denies feeling anxious prior to the episodes, states Jordan [name] feels anxious when his heart is racing. Denies caffeine in take, does not take illicit drugs, only current rx is an ear drop. No recent extended travel, smoking/tobacco use, history or family hx of PE/DVT, leg swelling. Patient states Jordan [name] has been unable to go to the gym and has not been eating as healthy as Jordan [name] normally does due to the coronavirus and quarantine polices in place. No other complaints or concerns.      Past Medical History:  Diagnosis Date  . Pneumothorax     Patient Active Problem List   Diagnosis Date Noted  . Medial epicondylitis, left 08/25/2018  . Dyspnea 11/22/2014  . Gait abnormality 11/11/2010  . Pes planus 11/11/2010    History reviewed. No pertinent surgical history.      Home Medications    Prior to Admission medications   Medication Sig Start Date End Date Taking? Authorizing Provider  neomycin-polymyxin-hydrocortisone (CORTISPORIN) OTIC solution Place 1 drop into the right ear 4 (four) times daily. 09/21/18  Yes [provider]  cyclobenzaprine (FLEXERIL) 10 MG tablet Take 1 tablet (10 mg total) by mouth at bedtime as needed for muscle spasms. Patient not taking: Reported on 09/27/2018 07/24/18   Thurman Coyer, DO  HYDROcodone-acetaminophen [facility]) 10-325 MG tablet Take 1  tablet by mouth every 8 (eight) hours as needed. Patient not taking: Reported on 09/27/2018 08/25/18   Silverio Decamp, MD    Family History Family History  Problem Relation Age of Onset  . Breast cancer Mother   . Prostate cancer Father   . Bladder Cancer Father   . Skin cancer Father   . Lung cancer Paternal Grandfather     Social History Social History   Tobacco Use  . Smoking status: Never Smoker  . Smokeless tobacco: Never Used  Substance Use Topics  . Alcohol use: No  . Drug use: No     Allergies   Patient has no known allergies.   Review of Systems Review of Systems  Constitutional: Negative for diaphoresis and fever.  Respiratory: Negative for chest tightness and shortness of breath.   Cardiovascular: Positive for palpitations. Negative for chest pain and leg swelling.  Gastrointestinal: Negative for nausea and vomiting.  Skin: Negative for rash and wound.  Allergic/Immunologic: Negative for immunocompromised state.  Neurological: Negative for dizziness, weakness and headaches.  Psychiatric/Behavioral: Negative for confusion.  All other systems reviewed and are negative.    Physical Exam Updated Vital Signs BP 125/67   Pulse 72   Temp 99 F (37.2 C) (Oral)   Resp 18   Ht 6' (1.829 m)   Wt 82.6 kg   SpO2 98%   BMI 24.68 kg/m   Physical Exam Vitals signs and nursing note reviewed.  Constitutional:      General: Jordan [name] is  not in acute distress.    Appearance: Jordan [name] is well-developed. Jordan [name] is not diaphoretic.  HENT:     Head: Normocephalic and atraumatic.  Cardiovascular:     Rate and Rhythm: Normal rate and regular rhythm.     Pulses: Normal pulses.     Heart sounds: Normal heart sounds.  Pulmonary:     Effort: Pulmonary effort is normal.     Breath sounds: Normal breath sounds.  Chest:     Chest wall: No tenderness.  Abdominal:     Tenderness: There is no abdominal tenderness.  Musculoskeletal:        General: No tenderness.     Right  lower leg: No edema.     Left lower leg: No edema.  Skin:    General: Skin is warm and dry.     Findings: No erythema or rash.  Neurological:     Mental Status: Jordan [name] is alert and oriented to person, place, and time.  Psychiatric:        Behavior: Behavior normal.      ED Treatments / Results  Labs (all labs ordered are listed, but only abnormal results are displayed) Labs Reviewed  URINALYSIS, ROUTINE W REFLEX MICROSCOPIC - Abnormal; Notable for the following components:      Result Value   Color, Urine STRAW (*)    Hgb urine dipstick SMALL (*)    All other components within normal limits  BASIC METABOLIC PANEL  CBC  TROPONIN I  TSH    EKG EKG Interpretation  Date/Time:  Wednesday September 27 2018 03:59:15 EDT Ventricular Rate:  76 PR Interval:    QRS Duration: 99 QT Interval:  382 QTC Calculation: 430 R Axis:   101 Text Interpretation:  Sinus rhythm Right axis deviation When compared with ECG of 01/04/2010, No significant change was found Confirmed by Delora Fuel ([id number]) on 09/27/2018 4:04:32 AM   Radiology Dg Chest Port 1 View  Result Date: 09/27/2018 CLINICAL DATA:  28 year old [name] with palpitations and nausea. EXAM: PORTABLE CHEST 1 VIEW COMPARISON:  Chest radiographs 10/17/2014. FINDINGS: Portable AP semi upright view at 0403 hours. Chronic staple line in the left upper lobe is stable. Stable lung volumes. Mediastinal contours remain normal. Visualized tracheal air column is within normal limits. No pneumothorax. Allowing for portable technique the lungs are clear. Mild scoliosis. No acute osseous abnormality identified. IMPRESSION: No acute cardiopulmonary abnormality. Electronically Signed   By: Genevie Ann M.D.   On: 09/27/2018 04:40    Procedures Procedures (including critical care time)  Medications Ordered in ED Medications - No data to display   Initial Impression / Assessment and Plan / ED Course  I have reviewed the triage vital signs and the nursing notes.   Pertinent labs & imaging results that were available during my care of the patient were reviewed by me and considered in my medical decision making (see chart for details).  Clinical Course as of Sep 27 531  Wed Sep 27, 6287  2229 28 year old [name] with complaint of palpitations which woke him from his sleep tonight.  Patient reports this is the third episode in 4 days.  On exam patient is well-appearing, heart rate regular rate and rhythm, blood pressure initially elevated at 144/78 has improved to 125/67.  ER work-up today including EKG, CBC, BMP, troponin, TSH are all within normal limits, no acute ischemic changes on EKG, unchanged from previous.  Discussed results with patient, recommend follow-up with PCP for possible event monitor or cardio evaluation.  Patient requests urinalysis, states that his father has a genetic kidney condition and had similar symptoms.  Urinalysis added to work-up today although offered reassurance that renal function is normal today.   [LM]    Clinical Course User Index [LM] Tacy Learn, PA-C      Final Clinical Impressions(s) / ED Diagnoses   Final diagnoses:  Palpitations    ED Discharge Orders    None       Roque Lias [birth year] 2903    Delora Fuel, MD 79/55/83 [phone]

## 2018-09-27 NOTE — ED Triage Notes (Signed)
Pt complains of nausea and palpitations. He  reports onset upon waking today one hour prior to arrival. Pt states he has had a 2-3 similar episodes in the past week but states symptoms worsened tonight.

## 2018-09-27 NOTE — Discharge Instructions (Addendum)
See directions for how to check your pulse. Try and count your pulse (heart rate) if you have another episode. Contact your doctor for follow up, return to the ER for new or worsening symptoms.  Your work up today including complete blood count, basic metabolic panel, thyroid, EKG and chest x-ray do not show a cause for your symptoms.

## 2018-09-28 ENCOUNTER — Ambulatory Visit: Payer: Self-pay | Admitting: General Surgery

## 2018-09-28 DIAGNOSIS — D171 Benign lipomatous neoplasm of skin and subcutaneous tissue of trunk: Secondary | ICD-10-CM | POA: Diagnosis not present

## 2018-09-29 ENCOUNTER — Other Ambulatory Visit: Payer: Self-pay | Admitting: General Surgery

## 2018-09-29 DIAGNOSIS — H9209 Otalgia, unspecified ear: Secondary | ICD-10-CM | POA: Diagnosis not present

## 2018-09-29 DIAGNOSIS — M6281 Muscle weakness (generalized): Secondary | ICD-10-CM | POA: Diagnosis not present

## 2018-09-29 DIAGNOSIS — M25522 Pain in left elbow: Secondary | ICD-10-CM | POA: Diagnosis not present

## 2018-10-02 ENCOUNTER — Other Ambulatory Visit: Payer: Self-pay | Admitting: General Surgery

## 2018-10-02 DIAGNOSIS — D171 Benign lipomatous neoplasm of skin and subcutaneous tissue of trunk: Secondary | ICD-10-CM

## 2018-10-04 DIAGNOSIS — M6281 Muscle weakness (generalized): Secondary | ICD-10-CM | POA: Diagnosis not present

## 2018-10-04 DIAGNOSIS — M25522 Pain in left elbow: Secondary | ICD-10-CM | POA: Diagnosis not present

## 2018-10-05 DIAGNOSIS — M25522 Pain in left elbow: Secondary | ICD-10-CM | POA: Diagnosis not present

## 2018-10-05 DIAGNOSIS — M6281 Muscle weakness (generalized): Secondary | ICD-10-CM | POA: Diagnosis not present

## 2018-10-06 ENCOUNTER — Ambulatory Visit
Admission: RE | Admit: 2018-10-06 | Discharge: 2018-10-06 | Disposition: A | Discharge status: Home / Self Care | Payer: 59 | Source: Ambulatory Visit | Attending: General Surgery | Admitting: General Surgery

## 2018-10-06 ENCOUNTER — Other Ambulatory Visit: Payer: Self-pay

## 2018-10-06 DIAGNOSIS — D171 Benign lipomatous neoplasm of skin and subcutaneous tissue of trunk: Secondary | ICD-10-CM | POA: Diagnosis not present

## 2018-10-09 DIAGNOSIS — M25522 Pain in left elbow: Secondary | ICD-10-CM | POA: Diagnosis not present

## 2018-10-12 DIAGNOSIS — M25522 Pain in left elbow: Secondary | ICD-10-CM | POA: Diagnosis not present

## 2018-10-12 DIAGNOSIS — M6281 Muscle weakness (generalized): Secondary | ICD-10-CM | POA: Diagnosis not present

## 2018-10-16 DIAGNOSIS — M6281 Muscle weakness (generalized): Secondary | ICD-10-CM | POA: Diagnosis not present

## 2018-10-16 DIAGNOSIS — M25522 Pain in left elbow: Secondary | ICD-10-CM | POA: Diagnosis not present

## 2018-10-18 ENCOUNTER — Other Ambulatory Visit: Payer: Self-pay | Admitting: General Surgery

## 2018-10-18 DIAGNOSIS — D171 Benign lipomatous neoplasm of skin and subcutaneous tissue of trunk: Secondary | ICD-10-CM | POA: Diagnosis not present

## 2018-10-20 ENCOUNTER — Ambulatory Visit (INDEPENDENT_AMBULATORY_CARE_PROVIDER_SITE_OTHER): Payer: 59 | Admitting: Sports Medicine

## 2018-10-20 ENCOUNTER — Encounter: Payer: Self-pay | Admitting: Sports Medicine

## 2018-10-20 DIAGNOSIS — M7702 Medial epicondylitis, left elbow: Secondary | ICD-10-CM | POA: Diagnosis not present

## 2018-10-20 NOTE — Patient Instructions (Signed)
Now approximately 7 weeks post PRP percutaneous tenotomy of the medial epicondyle. Initially he was pain-free, unfortunately had an injury at work, I think this was more of a simple common flexor strain, bedside ultrasound did not show any large defects in the common flexor tendon. Continue physical therapy, I think we only lost maybe 2 weeks here with the injury, and that things will get back on track in a couple of weeks, we are looking at sometime the first week of June for turning him lose to do things he like to do. If persistent discomfort after mid-June we will get another MRI and discuss Tenex.

## 2018-10-20 NOTE — Progress Notes (Signed)
Subjective:    CC: Left elbow  HPI: Jordan [name] is a pleasant 28 year old male, we treated him with a PRP percutaneous tenotomy of the common flexor tendon origin of the left elbow, treatment was about 7 weeks ago.  He was doing extremely well, he was completely pain-free.  Unfortunately at work he was loading some boxes with his right hand a week ago, a couple of the boxes became unsteady and were going to fall on 1 of his coworkers, he caught them with his left hand, he had immediate pain at the common flexor tendon origin.  He was extremely disheartened, as he felt as though he was completely better.  Now he has mild pain, not as bad as before the procedure localized at the common flexor tendon origin.  I reviewed the past medical history, family history, social history, surgical history, and allergies today and no changes were needed.  Please see the problem list section below in epic for further details.  Past Medical History: Past Medical History:  Diagnosis Date  . Pneumothorax    Past Surgical History: No past surgical history on file. Social History: Social History   Socioeconomic History  . Marital status: Married    Spouse name: Not on file  . Number of children: Not on file  . Years of education: Not on file  . Highest education level: Not on file  Occupational History  . Not on file  Social Needs  . Financial resource strain: Not on file  . Food insecurity:    Worry: Not on file    Inability: Not on file  . Transportation needs:    Medical: Not on file    Non-medical: Not on file  Tobacco Use  . Smoking status: Never Smoker  . Smokeless tobacco: Never Used  Substance and Sexual Activity  . Alcohol use: No  . Drug use: No  . Sexual activity: Not on file  Lifestyle  . Physical activity:    Days per week: Not on file    Minutes per session: Not on file  . Stress: Not on file  Relationships  . Social connections:    Talks on phone: Not on file    Gets together:  Not on file    Attends religious service: Not on file    Active member of club or organization: Not on file    Attends meetings of clubs or organizations: Not on file    Relationship status: Not on file  Other Topics Concern  . Not on file  Social History Narrative  . Not on file   Family History: Family History  Problem Relation Age of Onset  . Breast cancer Mother   . Prostate cancer Father   . Bladder Cancer Father   . Skin cancer Father   . Lung cancer Paternal Grandfather    Allergies: No Known Allergies Medications: See med rec.  Review of Systems: No fevers, chills, night sweats, weight loss, chest pain, or shortness of breath.   Objective:    General: Well Developed, well nourished, and in no acute distress.  Neuro: Alert and oriented x3, extra-ocular muscles intact, sensation grossly intact.  HEENT: Normocephalic, atraumatic, pupils equal round reactive to light, neck supple, no masses, no lymphadenopathy, thyroid nonpalpable.  Skin: Warm and dry, no rashes. Cardiac: Regular rate and rhythm, no murmurs rubs or gallops, no lower extremity edema.  Respiratory: Clear to auscultation bilaterally. Not using accessory muscles, speaking in full sentences. Right elbow: Unremarkable to inspection. Range of  motion full pronation, supination, flexion, extension. Strength is full to all of the above directions Stable to varus, valgus stress. Negative moving valgus stress test. Only a tiny bit of tenderness at the common flexor tendon origin Ulnar nerve does not sublux. Negative cubital tunnel Tinel's.  Impression and Recommendations:    Medial epicondylitis, left Now approximately 7 weeks post PRP percutaneous tenotomy of the medial epicondyle. Initially he was pain-free, unfortunately had an injury at work, I think this was more of a simple common flexor strain, bedside ultrasound did not show any large defects in the common flexor tendon, I did see hypoechoic area  initially but on re-angling of the probe this was simply anisotropy. Continue physical therapy, I think we only lost maybe 2 weeks here with the injury, and that things will get back on track in a couple of weeks, we are looking at sometime the first week of June for turning him lose to do things he like to do. If persistent discomfort after mid-June we will get another MRI and discuss Tenex.   ___________________________________________ Gwen Her. Dianah Field, M.D., ABFM., CAQSM. Primary Care and Sports Medicine [facility]  Adjunct Professor of [employer]

## 2018-10-20 NOTE — Assessment & Plan Note (Addendum)
Now approximately 7 weeks post PRP percutaneous tenotomy of the medial epicondyle. Initially he was pain-free, unfortunately had an injury at work, I think this was more of a simple common flexor strain, bedside ultrasound did not show any large defects in the common flexor tendon, I did see hypoechoic area initially but on re-angling of the probe this was simply anisotropy. Continue physical therapy, I think we only lost maybe 2 weeks here with the injury, and that things will get back on track in a couple of weeks, we are looking at sometime the first week of June for turning him lose to do things he like to do. If persistent discomfort after mid-June we will get another MRI and discuss Tenex.

## 2018-10-23 DIAGNOSIS — M25522 Pain in left elbow: Secondary | ICD-10-CM | POA: Diagnosis not present

## 2018-10-23 DIAGNOSIS — M6281 Muscle weakness (generalized): Secondary | ICD-10-CM | POA: Diagnosis not present

## 2018-10-25 DIAGNOSIS — M25522 Pain in left elbow: Secondary | ICD-10-CM | POA: Diagnosis not present

## 2018-10-25 DIAGNOSIS — M6281 Muscle weakness (generalized): Secondary | ICD-10-CM | POA: Diagnosis not present

## 2018-10-26 ENCOUNTER — Ambulatory Visit
Admission: RE | Admit: 2018-10-26 | Discharge: 2018-10-26 | Disposition: A | Discharge status: Home / Self Care | Payer: 59 | Source: Ambulatory Visit | Attending: General Surgery | Admitting: General Surgery

## 2018-10-26 ENCOUNTER — Other Ambulatory Visit: Payer: Self-pay

## 2018-10-26 DIAGNOSIS — D171 Benign lipomatous neoplasm of skin and subcutaneous tissue of trunk: Secondary | ICD-10-CM | POA: Diagnosis not present

## 2018-10-26 MED ORDER — GADOBENATE DIMEGLUMINE 529 MG/ML IV SOLN
17.0000 mL | Freq: Once | INTRAVENOUS | Status: AC | PRN
  Administered 2018-10-26: 17:00:00 17 mL via INTRAVENOUS

## 2018-10-28 ENCOUNTER — Other Ambulatory Visit: Payer: 59

## 2018-11-07 DIAGNOSIS — H6983 Other specified disorders of Eustachian tube, bilateral: Secondary | ICD-10-CM | POA: Insufficient documentation

## 2018-11-07 DIAGNOSIS — H6993 Unspecified Eustachian tube disorder, bilateral: Secondary | ICD-10-CM | POA: Insufficient documentation

## 2018-11-10 ENCOUNTER — Ambulatory Visit (INDEPENDENT_AMBULATORY_CARE_PROVIDER_SITE_OTHER): Payer: 59 | Admitting: Sports Medicine

## 2018-11-10 ENCOUNTER — Encounter: Payer: Self-pay | Admitting: Sports Medicine

## 2018-11-10 DIAGNOSIS — M7702 Medial epicondylitis, left elbow: Secondary | ICD-10-CM | POA: Diagnosis not present

## 2018-11-10 NOTE — Assessment & Plan Note (Signed)
Approximately 9 to 10 weeks post PRP percutaneous tenotomy of the left medial epicondyle. He had a slight setback with an injury at work, and is now improving again. He feels as though he may be better than before the procedure. Continue physical therapy for an additional 3 weeks before considering MRI if still having discomfort. We also discussed Tenex as a possibility should he continue to have pain after the 3-week point from today.

## 2018-11-10 NOTE — Progress Notes (Signed)
Virtual Visit via WebEx/MyChart   I connected with  Jordan [name]  on 11/10/18 via WebEx/MyChart/Doximity Video and verified that I am speaking with the correct person using two identifiers.   I discussed the limitations, risks, security and privacy concerns of performing an evaluation and management service by WebEx/MyChart/Doximity Video, including the higher likelihood of inaccurate diagnosis and treatment, and the availability of in person appointments.  We also discussed the likely need of an additional face to face encounter for complete and high quality delivery of care.  I also discussed with the patient that there may be a patient responsible charge related to this service. The patient expressed understanding and wishes to proceed.  Provider location is either at home or medical facility. Patient location is at their home, different from provider location. People involved in care of the patient during this telehealth encounter were myself, my nurse/medical assistant, and my front office/scheduling team member.  Subjective:    CC: Follow-up  HPI: Jordan [name] is a pleasant 28 year old male, 9 weeks ago I performed a percutaneous tenotomy of his left common flexor tendon origin.  He was doing well, then had a minor setback with an injury at work.  He has now continue to improve with physical therapy, they are doing a gentle increase in his weight, he is currently up 4 pounds resistance.  I reviewed the past medical history, family history, social history, surgical history, and allergies today and no changes were needed.  Please see the problem list section below in epic for further details.  Past Medical History: Past Medical History:  Diagnosis Date  . Pneumothorax    Past Surgical History: No past surgical history on file. Social History: Social History   Socioeconomic History  . Marital status: Married    Spouse name: Not on file  . Number of children: Not on file  . Years of  education: Not on file  . Highest education level: Not on file  Occupational History  . Not on file  Social Needs  . Financial resource strain: Not on file  . Food insecurity:    Worry: Not on file    Inability: Not on file  . Transportation needs:    Medical: Not on file    Non-medical: Not on file  Tobacco Use  . Smoking status: Never Smoker  . Smokeless tobacco: Never Used  Substance and Sexual Activity  . Alcohol use: No  . Drug use: No  . Sexual activity: Not on file  Lifestyle  . Physical activity:    Days per week: Not on file    Minutes per session: Not on file  . Stress: Not on file  Relationships  . Social connections:    Talks on phone: Not on file    Gets together: Not on file    Attends religious service: Not on file    Active member of club or organization: Not on file    Attends meetings of clubs or organizations: Not on file    Relationship status: Not on file  Other Topics Concern  . Not on file  Social History Narrative  . Not on file   Family History: Family History  Problem Relation Age of Onset  . Breast cancer Mother   . Prostate cancer Father   . Bladder Cancer Father   . Skin cancer Father   . Lung cancer Paternal Grandfather    Allergies: No Known Allergies Medications: See med rec.  Review of Systems: No fevers,  chills, night sweats, weight loss, chest pain, or shortness of breath.   Objective:    General: Speaking full sentences, no audible heavy breathing.  Sounds alert and appropriately interactive.  Appears well.  Face symmetric.  Extraocular movements intact.  Pupils equal and round.  No nasal flaring or accessory muscle use visualized.  No other physical exam performed due to the non-physical nature of this visit.  Impression and Recommendations:    Medial epicondylitis, left Approximately 9 to 10 weeks post PRP percutaneous tenotomy of the left medial epicondyle. He had a slight setback with an injury at work, and is now  improving again. He feels as though he may be better than before the procedure. Continue physical therapy for an additional 3 weeks before considering MRI if still having discomfort. We also discussed Tenex as a possibility should he continue to have pain after the 3-week point from today.  I discussed the above assessment and treatment plan with the patient. The patient was provided an opportunity to ask questions and all were answered. The patient agreed with the plan and demonstrated an understanding of the instructions.   The patient was advised to call back or seek an in-person evaluation if the symptoms worsen or if the condition fails to improve as anticipated.   I provided 25 minutes of non-face-to-face time during this encounter, 15 minutes of additional time was needed to gather information, review chart, records, communicate/coordinate with staff remotely, troubleshooting the multiple errors that we get every time when trying to do video calls through the electronic medical record, WebEx, and Doximity, restart the encounter multiple times due to instability of the software, as well as complete documentation.   ___________________________________________ Gwen Her. Dianah Field, M.D., ABFM., CAQSM. Primary Care and Sports Medicine [facility]  Adjunct Professor of [place] [employer] [facility] [employer]

## 2018-11-13 ENCOUNTER — Telehealth: Payer: Self-pay | Admitting: Sports Medicine

## 2018-11-13 NOTE — Telephone Encounter (Signed)
Patient left a VM that stating that the Physical Therapist talked to him about dry needling to add with his current PT regimen. Please advise.

## 2018-11-14 NOTE — Telephone Encounter (Signed)
Patient notified and did not have any further questions. Patient has letter now.

## 2018-11-14 NOTE — Telephone Encounter (Signed)
Avoid dry needling for this for now.

## 2018-11-15 ENCOUNTER — Ambulatory Visit: Payer: 59 | Admitting: Orthopaedic Surgery

## 2018-11-21 ENCOUNTER — Ambulatory Visit: Payer: 59 | Admitting: Orthopaedic Surgery

## 2018-11-29 ENCOUNTER — Ambulatory Visit (INDEPENDENT_AMBULATORY_CARE_PROVIDER_SITE_OTHER): Payer: 59 | Admitting: Orthopaedic Surgery

## 2018-11-29 ENCOUNTER — Encounter: Payer: Self-pay | Admitting: Orthopaedic Surgery

## 2018-11-29 ENCOUNTER — Other Ambulatory Visit: Payer: Self-pay

## 2018-11-29 VITALS — Ht 72.0 in | Wt 175.0 lb

## 2018-11-29 DIAGNOSIS — M546 Pain in thoracic spine: Secondary | ICD-10-CM | POA: Diagnosis not present
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
## 2018-12-29 ENCOUNTER — Ambulatory Visit (INDEPENDENT_AMBULATORY_CARE_PROVIDER_SITE_OTHER): Payer: 59 | Admitting: Sports Medicine

## 2018-12-29 ENCOUNTER — Other Ambulatory Visit: Payer: Self-pay

## 2018-12-29 ENCOUNTER — Encounter: Payer: Self-pay | Admitting: Sports Medicine

## 2018-12-29 DIAGNOSIS — M7702 Medial epicondylitis, left elbow: Secondary | ICD-10-CM | POA: Diagnosis not present

## 2018-12-29 NOTE — Assessment & Plan Note (Signed)
We are now approximately 4 months post PRP percutaneous tenotomy of the left medial epicondyle/flexor pronator mass origin. He is approximately 70 to 80% better, and feels as though he could live with it now, he is currently increasing weights for eccentric elbow curls as well as wrist curls. He is interested in Tenex should this fail, however if he continues to have discomfort I would want him referred to shoulder and elbow surgery. In the meantime I think we need a new MRI to reevaluate as he is still having some symptoms.

## 2018-12-29 NOTE — Progress Notes (Addendum)
Subjective:    CC: Recheck elbow  HPI: Jordan [name] is a pleasant 28 year old male, he is about 4 months post percutaneous tenotomy with PRP of his left common flexor tendon origin.  He tells me he is 70 to 80% better now.  Unfortunately he continues to have some discomfort, and still has difficulty advancing his weight lifting.  I reviewed the past medical history, family history, social history, surgical history, and allergies today and no changes were needed.  Please see the problem list section below in epic for further details.  Past Medical History: Past Medical History:  Diagnosis Date  . Pneumothorax    Past Surgical History: No past surgical history on file. Social History: Social History   Socioeconomic History  . Marital status: Married    Spouse name: Not on file  . Number of children: Not on file  . Years of education: Not on file  . Highest education level: Not on file  Occupational History  . Not on file  Social Needs  . Financial resource strain: Not on file  . Food insecurity    Worry: Not on file    Inability: Not on file  . Transportation needs    Medical: Not on file    Non-medical: Not on file  Tobacco Use  . Smoking status: Never Smoker  . Smokeless tobacco: Never Used  Substance and Sexual Activity  . Alcohol use: No  . Drug use: No  . Sexual activity: Not on file  Lifestyle  . Physical activity    Days per week: Not on file    Minutes per session: Not on file  . Stress: Not on file  Relationships  . Social Herbalist on phone: Not on file    Gets together: Not on file    Attends religious service: Not on file    Active member of club or organization: Not on file    Attends meetings of clubs or organizations: Not on file    Relationship status: Not on file  Other Topics Concern  . Not on file  Social History Narrative  . Not on file   Family History: Family History  Problem Relation Age of Onset  . Breast cancer Mother   .  Prostate cancer Father   . Bladder Cancer Father   . Skin cancer Father   . Lung cancer Paternal Grandfather    Allergies: No Known Allergies Medications: See med rec.  Review of Systems: No fevers, chills, night sweats, weight loss, chest pain, or shortness of breath.   Objective:    General: Well Developed, well nourished, and in no acute distress.  Neuro: Alert and oriented x3, extra-ocular muscles intact, sensation grossly intact.  HEENT: Normocephalic, atraumatic, pupils equal round reactive to light, neck supple, no masses, no lymphadenopathy, thyroid nonpalpable.  Skin: Warm and dry, no rashes. Cardiac: Regular rate and rhythm, no murmurs rubs or gallops, no lower extremity edema.  Respiratory: Clear to auscultation bilaterally. Not using accessory muscles, speaking in full sentences. Left elbow: Unremarkable to inspection. Range of motion full pronation, supination, flexion, extension. Strength is full to all of the above directions Stable to varus, valgus stress. Negative moving valgus stress test. No discrete areas of tenderness to palpation. Ulnar nerve does not sublux. Negative cubital tunnel Tinel's.    Impression and Recommendations:    Medial epicondylitis, left We are now approximately 4 months post PRP percutaneous tenotomy of the left medial epicondyle/flexor pronator mass origin. He is approximately  70 to 80% better, and feels as though he could live with it now, he is currently increasing weights for eccentric elbow curls as well as wrist curls. He is interested in Tenex should this fail, however if he continues to have discomfort I would want him referred to shoulder and elbow surgery. In the meantime I think we need a new MRI to reevaluate as he is still having some symptoms.  I spent 25 minutes with this patient, greater than 50% was face-to-face time counseling regarding the above diagnoses.  ___________________________________________ Gwen Her.  Dianah Field, M.D., ABFM., CAQSM. Primary Care and Sports Medicine [facility]  Adjunct Professor of [place] of [facility] [employer]

## 2019-01-08 ENCOUNTER — Other Ambulatory Visit: Payer: Self-pay

## 2019-01-08 ENCOUNTER — Ambulatory Visit (INDEPENDENT_AMBULATORY_CARE_PROVIDER_SITE_OTHER): Payer: 59

## 2019-01-08 DIAGNOSIS — M7702 Medial epicondylitis, left elbow: Secondary | ICD-10-CM

## 2019-05-30 ENCOUNTER — Ambulatory Visit: Payer: 59 | Admitting: Orthopaedic Surgery

## 2019-06-06 ENCOUNTER — Other Ambulatory Visit: Payer: Self-pay | Admitting: Internal Medicine

## 2019-10-13 ENCOUNTER — Other Ambulatory Visit: Payer: Self-pay

## 2019-10-13 ENCOUNTER — Emergency Department (HOSPITAL_COMMUNITY): Payer: 59

## 2019-10-13 ENCOUNTER — Encounter (HOSPITAL_COMMUNITY): Payer: Self-pay | Admitting: Emergency Medicine

## 2019-10-13 ENCOUNTER — Emergency Department (HOSPITAL_COMMUNITY)
Admission: EM | Admit: 2019-10-13 | Discharge: 2019-10-13 | Disposition: A | Discharge status: Home / Self Care | Payer: 59 | Attending: Emergency Medicine | Admitting: Emergency Medicine

## 2019-10-13 ENCOUNTER — Emergency Department (HOSPITAL_BASED_OUTPATIENT_CLINIC_OR_DEPARTMENT_OTHER): Payer: 59

## 2019-10-13 DIAGNOSIS — Z79899 Other long term (current) drug therapy: Secondary | ICD-10-CM | POA: Diagnosis not present

## 2019-10-13 DIAGNOSIS — T796XXA Traumatic ischemia of muscle, initial encounter: Secondary | ICD-10-CM

## 2019-10-13 DIAGNOSIS — R609 Edema, unspecified: Secondary | ICD-10-CM

## 2019-10-13 DIAGNOSIS — M7989 Other specified soft tissue disorders: Secondary | ICD-10-CM | POA: Diagnosis not present

## 2019-10-13 DIAGNOSIS — R2243 Localized swelling, mass and lump, lower limb, bilateral: Secondary | ICD-10-CM | POA: Diagnosis present

## 2019-10-13 LAB — BASIC METABOLIC PANEL
Anion gap: 14 (ref 5–15)
BUN: 18 mg/dL (ref 6–20)
CO2: 25 mmol/L (ref 22–32)
Calcium: 9.8 mg/dL (ref 8.9–10.3)
Chloride: 100 mmol/L (ref 98–111)
Creatinine, Ser: 1.11 mg/dL (ref 0.61–1.24)
GFR calc Af Amer: >60 mL/min (ref >60)
GFR calc non Af Amer: >60 mL/min (ref >60)
Glucose, Bld: 93 mg/dL (ref 70–99)
Potassium: 3.9 mmol/L (ref 3.5–5.1)
Sodium: 139 mmol/L (ref 135–145)

## 2019-10-13 LAB — CBC WITH DIFFERENTIAL/PLATELET
Abs Immature Granulocytes: 0.01 10*3/uL (ref 0.00–0.07)
Basophils Absolute: 0 10*3/uL (ref 0.0–0.1)
Basophils Relative: 1 %
Eosinophils Absolute: 0 10*3/uL (ref 0.0–0.5)
Eosinophils Relative: 1 %
HCT: 45.2 % (ref 39.0–52.0)
Hemoglobin: 14.1 g/dL (ref 13.0–17.0)
Immature Granulocytes: 0 %
Lymphocytes Relative: 50 %
Lymphs Abs: 3.4 10*3/uL (ref 0.7–4.0)
MCH: 29.2 pg (ref 26.0–34.0)
MCHC: 31.2 g/dL (ref 30.0–36.0)
MCV: 93.6 fL (ref 80.0–100.0)
Monocytes Absolute: 0.4 10*3/uL (ref 0.1–1.0)
Monocytes Relative: 6 %
Neutro Abs: 2.8 10*3/uL (ref 1.7–7.7)
Neutrophils Relative %: 42 %
Platelets: 215 10*3/uL (ref 150–400)
RBC: 4.83 MIL/uL (ref 4.22–5.81)
RDW: 12.3 % (ref 11.5–15.5)
WBC: 6.7 10*3/uL (ref 4.0–10.5)
nRBC: 0 % (ref 0.0–0.2)

## 2019-10-13 LAB — CK: Total CK: 7283 U/L — ABNORMAL HIGH (ref 49–397)

## 2019-10-13 MED ORDER — SODIUM CHLORIDE 0.9 % IV BOLUS: 1000.0000 mL | Freq: Once | INTRAVENOUS | Status: DC

## 2019-10-13 NOTE — ED Provider Notes (Signed)
[facility] EMERGENCY DEPARTMENT Provider Note   CSN: [id number] Arrival date & time: 10/13/19  1731   History Chief Complaint  Patient presents with  . Foot Swelling   Jordan [name] is a 29 y.o. male with past medical history significant for pes planus who presents for evaluation of leg swelling.  Patient states he just noticed this 2 hours ago.  Denies any recent injury or trauma however did state approximately 5 days ago he had the "normal yes leg workout I have ever had."  States he had "horrible and ridiculous cramps of bilateral legs."  States only 2 days ago was he able to actually ambulate without significant pain.  He has no current pain.  He has noticed pain to his left ankle as well as the posterior and lateral aspect of his right foot.  No surrounding erythema or warmth.  No chest pain, shortness of breath.  No recent surgery, mobilization, history of PE, DVT, malignancy.  Denies additional aggravating or alleviating factors.  His PCP sent him here for further evaluation.   History obtained from patient and past medical history. No interpretor was used.  HPI     Past Medical History:  Diagnosis Date  . Pneumothorax     Patient Active Problem List   Diagnosis Date Noted  . Medial epicondylitis, left 08/25/2018  . Dyspnea 11/22/2014  . Gait abnormality 11/11/2010  . Pes planus 11/11/2010    History reviewed. No pertinent surgical history.     Family History  Problem Relation Age of Onset  . Breast cancer Mother   . Prostate cancer Father   . Bladder Cancer Father   . Skin cancer Father   . Lung cancer Paternal Grandfather     Social History   Tobacco Use  . Smoking status: Never Smoker  . Smokeless tobacco: Never Used  Substance Use Topics  . Alcohol use: No  . Drug use: No    Home Medications Prior to Admission medications   Medication Sig Start Date End Date Taking? Authorizing Provider  ADDERALL XR 10 MG 24 hr capsule Take 10 mg  by mouth every morning. 05/04/19   [provider]    Allergies    Patient has no known allergies.  Review of Systems   Review of Systems  Constitutional: Negative.   HENT: Negative.   Respiratory: Negative.   Cardiovascular: Negative.   Gastrointestinal: Negative.   Genitourinary: Negative.   Musculoskeletal: Negative for gait problem.       LLE edema  Skin: Negative.   Neurological: Negative.   All other systems reviewed and are negative.  Physical Exam Updated Vital Signs BP (!) 144/78   Pulse 68   Temp 98.5 F (36.9 C) (Oral)   Resp 20   Ht 6' (1.829 m)   Wt 81.6 kg   SpO2 97%   BMI 24.41 kg/m   Physical Exam Vitals and nursing note reviewed.  Constitutional:      General: He is not in acute distress.    Appearance: He is well-developed. He is not ill-appearing, toxic-appearing or diaphoretic.  HENT:     Head: Normocephalic and atraumatic.     Nose: Nose normal.     Mouth/Throat:     Mouth: Mucous membranes are moist.     Pharynx: Oropharynx is clear.  Eyes:     Pupils: Pupils are equal, round, and reactive to light.  Cardiovascular:     Rate and Rhythm: Normal rate and regular rhythm.  Pulses: Normal pulses.          Dorsalis pedis pulses are 2+ on the right side and 2+ on the left side.       Posterior tibial pulses are 2+ on the right side and 2+ on the left side.     Heart sounds: Normal heart sounds.  Pulmonary:     Effort: Pulmonary effort is normal. No respiratory distress.     Breath sounds: Normal breath sounds.  Abdominal:     General: Bowel sounds are normal. There is no distension.     Palpations: Abdomen is soft.  Musculoskeletal:        General: Normal range of motion.     Cervical back: Normal range of motion and neck supple.     Right lower leg: Normal.     Left lower leg: Swelling present.     Right ankle: Normal.     Right Achilles Tendon: No tenderness or defects. Thompson's test negative.     Left ankle: Swelling  present.     Left Achilles Tendon: No tenderness or defects. Thompson's test negative.     Right foot: Normal.     Left foot: Swelling present.     Comments: Soft tissue edema to distal tibia and fibula on the lateral posterior aspect extending over Achilles tendon to left lateral foot.  No erythema or warmth.  Able to plantarflex and dorsiflex bilateral lower extremities at difficulty.  2+ DP, PT pulses.  No tenderness.  Feet:     Right foot:     Skin integrity: Skin integrity normal.     Left foot:     Skin integrity: Skin integrity normal.  Skin:    General: Skin is warm and dry.     Capillary Refill: Capillary refill takes less than 2 seconds.     Comments: Nonpitting soft tissue swelling to distal tibia-fibula to posterior and lateral aspect extending into left lateral foot and lateral ankle over malleolus.  No erythema or warmth.  No fluctuance or induration.  No contusions or abrasions.  Neurological:     General: No focal deficit present.     Mental Status: He is alert.     Cranial Nerves: Cranial nerves are intact.     Sensory: Sensation is intact.     Motor: Motor function is intact.     Coordination: Coordination is intact.     Gait: Gait is intact.     Comments: 5/5 strength to bilateral lower extremities at difficulty.  Ambulatory with out difficulty.    ED Results / Procedures / Treatments   Labs (all labs ordered are listed, but only abnormal results are displayed) Labs Reviewed  CK - Abnormal; Notable for the following components:      Result Value   Total CK 7,283 (*)    All other components within normal limits  CBC WITH DIFFERENTIAL/PLATELET  BASIC METABOLIC PANEL    EKG None  Radiology DG Tibia/Fibula Left  Result Date: 10/13/2019 CLINICAL DATA:  Left foot and ankle swelling x2 hours. EXAM: LEFT TIBIA AND FIBULA - 2 VIEW COMPARISON:  None. FINDINGS: There is no acute displaced fracture. There is no dislocation. There is nonspecific soft tissue swelling  involving the left lower extremity. There is no radiopaque foreign body. There is no subcutaneous gas. IMPRESSION: Nonspecific soft tissue swelling. No acute displaced fracture or dislocation. Electronically Signed   By: Constance Holster M.D.   On: 10/13/2019 19:16   DG Foot Complete Left  Result Date: 10/13/2019 CLINICAL DATA:  Foot pain and swelling. EXAM: LEFT FOOT - COMPLETE 3+ VIEW COMPARISON:  None. FINDINGS: There is no evidence of fracture or dislocation. There is no evidence of arthropathy or other focal bone abnormality. Soft tissues are unremarkable. There is an os naviculare IMPRESSION: Negative. Electronically Signed   By: Constance Holster M.D.   On: 10/13/2019 19:17   VAS [place] LOWER EXTREMITY VENOUS (DVT) (ONLY [facility] & [facility])  Result Date: 10/13/2019  Lower Venous DVTStudy Indications: Swelling.  Performing Technologist: Antonieta Pert RDMS, RVT  Examination Guidelines: A complete evaluation includes B-mode imaging, spectral Doppler, color Doppler, and power Doppler as needed of all accessible portions of each vessel. Bilateral testing is considered an integral part of a complete examination. Limited examinations for reoccurring indications may be performed as noted. The reflux portion of the exam is performed with the patient in reverse Trendelenburg.  +-----+---------------+---------+-----------+----------+--------------+ RIGHTCompressibilityPhasicitySpontaneityPropertiesThrombus Aging +-----+---------------+---------+-----------+----------+--------------+ CFV  Full           Yes      Yes                                 +-----+---------------+---------+-----------+----------+--------------+   +---------+---------------+---------+-----------+----------+--------------+ LEFT     CompressibilityPhasicitySpontaneityPropertiesThrombus Aging +---------+---------------+---------+-----------+----------+--------------+ CFV      Full           Yes      Yes                                  +---------+---------------+---------+-----------+----------+--------------+ SFJ      Full                                                        +---------+---------------+---------+-----------+----------+--------------+ FV Prox  Full                                                        +---------+---------------+---------+-----------+----------+--------------+ FV Mid   Full                                                        +---------+---------------+---------+-----------+----------+--------------+ FV DistalFull                                                        +---------+---------------+---------+-----------+----------+--------------+ PFV      Full                                                        +---------+---------------+---------+-----------+----------+--------------+ POP      Full  Yes      Yes                                 +---------+---------------+---------+-----------+----------+--------------+ PTV      Full                                                        +---------+---------------+---------+-----------+----------+--------------+ PERO     Full                                                        +---------+---------------+---------+-----------+----------+--------------+ GSV      Full                                                        +---------+---------------+---------+-----------+----------+--------------+     Summary: RIGHT: - No evidence of common femoral vein obstruction.  LEFT: - There is no evidence of deep vein thrombosis in the lower extremity.  - No cystic structure found in the popliteal fossa.  *See table(s) above for measurements and observations.    Preliminary     Procedures Procedures (including critical care time)  Medications Ordered in ED Medications  sodium chloride 0.9 % bolus 1,000 mL (1,000 mLs Intravenous Refused 10/13/19 1940)    ED Course  I have reviewed the  triage vital signs and the nursing notes.  Pertinent labs & imaging results that were available during my care of the patient were reviewed by me and considered in my medical decision making (see chart for details).  29 year old male presents for evaluation of lower extremity edema.  Afebrile, nonseptic, non-ill-appearing.  Only recently noticed this approximately 2 hours ago.  No recent surgery, immobilization, malignancy, history of PE or DVT.  Denies chest pain, shortness of breath.  Patient without tachycardia, tachypnea or hypoxia.  No recent injury or trauma however states he did a "really hard in playing workout" approximately 5 days ago.  States he had had severe cramps to his bilateral calves at that time and only recently was able to walk 2 days ago.  No erythema or warmth.  Will to plantarflex and dorsiflex without difficulty.  2+ DP, PT pulses.  Low suspicion for septic joint, gout, hemarthrosis.  Given extreme workout 5 days ago will obtain labs, CK, x-ray to ensure no large joint effusion, ultrasound to rule out DVT and reassess has no pain currently.  Labs and imaging personally reviewed and interpreted: CBC without leukocytosis, hemoglobin 14.1 BMP without electrolyte, renal abnormality CK 7283 [place] negative for DVT DG foot no acute fracture, dislocation, effusion Dg tib/fib nonspecific soft tissue swelling to distal aspect  Patient reassessed.  Continued no pain.  Ambulatory without difficulty.  Discussed elevated CK consistent with rhabdomyolysis likely from his recent workouts.  Possible tendinitis however does not appear to have infectious process on exam.  I did place orders for 1 L of fluids which patient refused.  He is actively drinking on exam.  States he will drink increased fluids at home.  Discussed placement force fluids as well as close follow-up with PCP in 2 days for repeat of labs.  He is to return for any new or worsening symptoms.  Have low suspicion for septic joint,  gout, cellulitis, abscess, myositis, vascular occlusion, infectious process.  Negative thompson test low suspicion for Achilles tendon rupture  The patient has been appropriately medically screened and/or stabilized in the ED. I have low suspicion for any other emergent medical condition which would require further screening, evaluation or treatment in the ED or require inpatient management.  Patient is hemodynamically stable and in no acute distress.  Patient able to ambulate in department prior to ED.  Evaluation does not show acute pathology that would require ongoing or additional emergent interventions while in the emergency department or further inpatient treatment.  I have discussed the diagnosis with the patient and answered all questions.  Pain is been managed while in the emergency department and patient has no further complaints prior to discharge.  Patient is comfortable with plan discussed in room and is stable for discharge at this time.  I have discussed strict return precautions for returning to the emergency department.  Patient was encouraged to follow-up with PCP/specialist refer to at discharge.    MDM Rules/Calculators/A&P                       Final Clinical Impression(s) / ED Diagnoses Final diagnoses:  Edema  Traumatic rhabdomyolysis, initial encounter [facility])    Rx / DC Orders ED Discharge Orders    None       Erienne Spelman A, PA-C 10/13/19 2009    Charlesetta Shanks, MD 10/14/19 1805

## 2019-10-13 NOTE — ED Notes (Signed)
Pt returned from [facility] at this time.

## 2019-10-13 NOTE — ED Notes (Signed)
Pt transported to [facility] via stretcher at this time.   

## 2019-10-13 NOTE — Discharge Instructions (Signed)
Make sure to drink plenty of fluids.  If you notice any redness warmth or increased pain to this area please seek reevaluation  You need follow-up with your primary care provider early next week to have your labs rechecked.

## 2019-10-13 NOTE — ED Triage Notes (Signed)
C/o L foot and ankle swelling x 2 hours.  Denies pain.  States he called his PCP and was told to come to ED to R/o DVT.

## 2019-10-13 NOTE — Progress Notes (Signed)
Left lower extremity venous duplex complete. Please see CV Proc tab for preliminary results. Preliminary results given to RN @ 18:40. [place], RVT 6:44 PM  10/13/2019

## 2019-11-13 ENCOUNTER — Ambulatory Visit (INDEPENDENT_AMBULATORY_CARE_PROVIDER_SITE_OTHER): Payer: 59 | Admitting: Sports Medicine

## 2019-11-13 ENCOUNTER — Encounter: Payer: Self-pay | Admitting: Sports Medicine

## 2019-11-13 DIAGNOSIS — M7702 Medial epicondylitis, left elbow: Secondary | ICD-10-CM

## 2019-11-13 NOTE — Progress Notes (Signed)
    Procedures performed today:    Procedure: Diagnostic Ultrasound of left elbow Device: Samsung HS60  Findings: I imaged the medial epicondyle and common flexor tendon, I did see longitudinal split tears in the common flexor tendon, no full-thickness or retracted tears.  There is no increased power Doppler signal to suggest active inflammation. Images permanently stored and available for review in the ultrasound unit.  Impression: Longitudinal split tears in the common flexor tendon.  No active inflammation.  Independent interpretation of notes and tests performed by another provider:   None.  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  More recently he actually bumped his elbow on an object and had some increasing pain which then resolved.  He is interested in an ultrasound and repeat evaluation. I did note longitudinal tearing in the common flexor tendon without power Doppler signal suggesting split tearing without active inflammation. I am going to put him on some work restrictions for the next 2 weeks, he will follow-up with me in 2 weeks for repeat ultrasound though I did inform him that we would likely use his clinical presentation to determine release from restrictions rather than ultrasound appearance.  I spent 30 minutes of total time managing this patient today, this includes chart review, face to face, and non-face to face time.  This was separate from the time spent performing the above procedure.  ___________________________________________ Gwen Her. Dianah Field, M.D., ABFM., CAQSM. Primary Care and [place] Instructor of [place] of [facility] of Medicine

## 2019-11-13 NOTE — Assessment & Plan Note (Signed)
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  More recently he actually bumped his elbow on an object and had some increasing pain which then resolved.  He is interested in an ultrasound and repeat evaluation. I did note longitudinal tearing in the common flexor tendon without power Doppler signal suggesting split tearing without active inflammation. I am going to put him on some work restrictions for the next 2 weeks, he will follow-up with me in 2 weeks for repeat ultrasound though I did inform him that we would likely use his clinical presentation to determine release from restrictions rather than ultrasound appearance.

## 2019-11-27 ENCOUNTER — Ambulatory Visit (INDEPENDENT_AMBULATORY_CARE_PROVIDER_SITE_OTHER): Payer: 59 | Admitting: Sports Medicine

## 2019-11-27 ENCOUNTER — Encounter: Payer: Self-pay | Admitting: Sports Medicine

## 2019-11-27 DIAGNOSIS — M7702 Medial epicondylitis, left elbow: Secondary | ICD-10-CM | POA: Diagnosis not present

## 2019-11-27 NOTE — Assessment & Plan Note (Signed)
This is a pleasant 29 year old male, he has had recurrent episodes of left medial epicondylitis, last year we did a PRP percutaneous tenotomy that provided good relief, repeat MRI did show complete resolution of his common flexor tendinopathy. More recently he bumped his elbow, we ultrasounded to the last visit and this visit, things have improved since last visit with closure of any gaps, there was a little bit of anisotropy but no obvious splits in the tendon substance itself. We are going to be gradually returning him to full duty at work. Return to see me as needed.

## 2019-11-27 NOTE — Progress Notes (Signed)
    Procedures performed today:    Procedure: Diagnostic Ultrasound of left elbow Device: Samsung HS60  Findings: Unremarkable appearing common flexor tendon origin, there was a bit of hypoechoic change at the musculotendinous junction but on redirection of the probe we determined this was simply anisotropy. Images permanently stored and available for review in the ultrasound unit.  Impression: Full healing of common flexor tendinopathy.  Independent interpretation of notes and tests performed by another provider:   None.  Brief History, Exam, Impression, and Recommendations:    Medial epicondylitis, left This is a pleasant 29 year old male, he has had recurrent episodes of left medial epicondylitis, last year we did a PRP percutaneous tenotomy that provided good relief, repeat MRI did show complete resolution of his common flexor tendinopathy. More recently he bumped his elbow, we ultrasounded to the last visit and this visit, things have improved since last visit with closure of any gaps, there was a little bit of anisotropy but no obvious splits in the tendon substance itself. We are going to be gradually returning him to full duty at work. Return to see me as needed.    ___________________________________________ Gwen Her. Dianah Field, M.D., ABFM., CAQSM. Primary Care and [place] Instructor of [place] of [facility] of Medicine

## 2020-04-27 IMAGING — MR MR ELBOW*L* W/O CM
4 of 5 series · 26 of 40 positions shown · non-contrast
Comparison: None.

CLINICAL DATA: Three-month history of medial elbow pain. Possible
weight lifting injury.

EXAM:
MRI OF THE LEFT ELBOW WITHOUT CONTRAST
TECHNIQUE: Multiplanar, multisequence MR imaging of the elbow was performed. No
intravenous contrast was administered.

[Series 3: T2 fat-sat · axial · 3.0mm · 0.23mm/px · z∈[-30,+83]mm · 9 of 30 slices shown (1 of 2)]
[im 1/30]
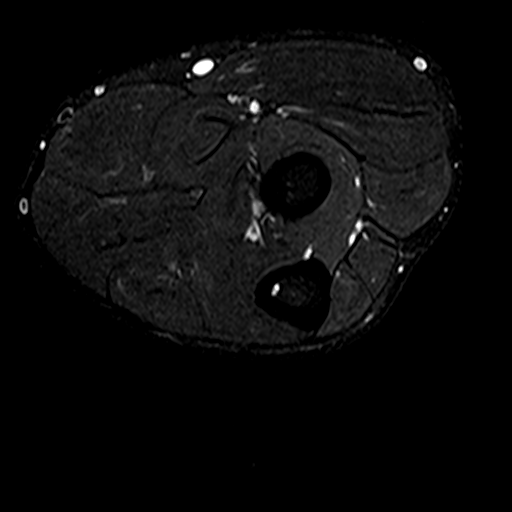
[im 4/30]
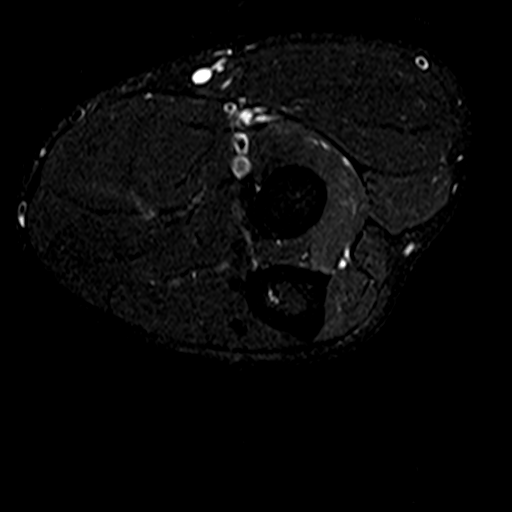
[im 8/30]
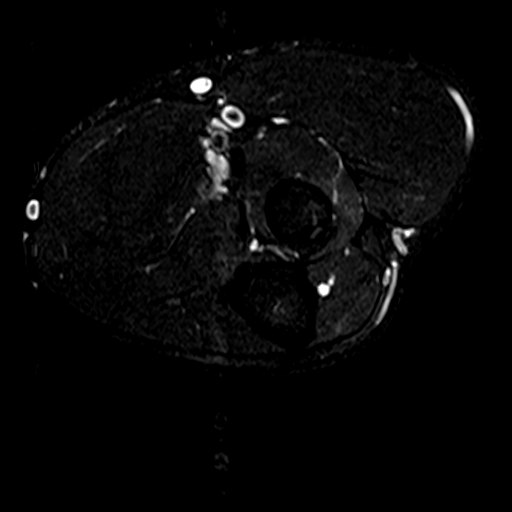
[im 11/30]
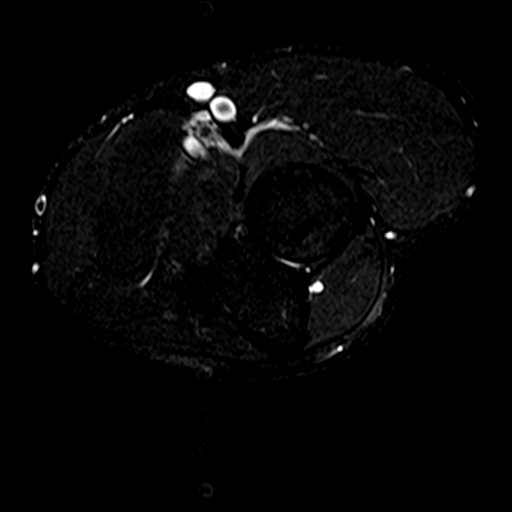
[im 15/30]
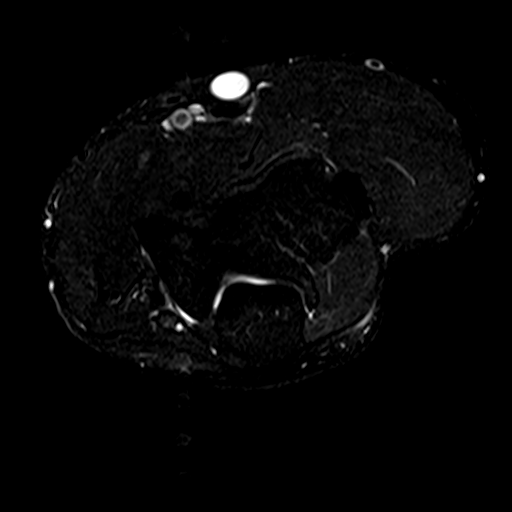
[im 19/30]
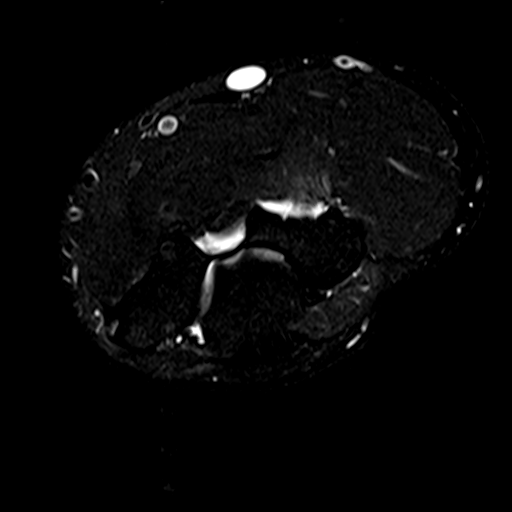
[im 22/30]
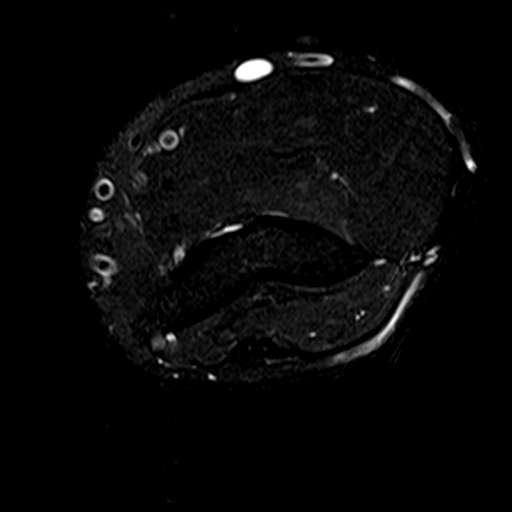
[im 26/30]
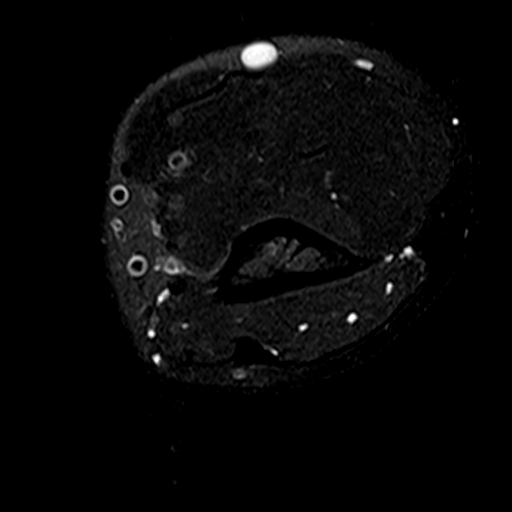
[im 30/30]
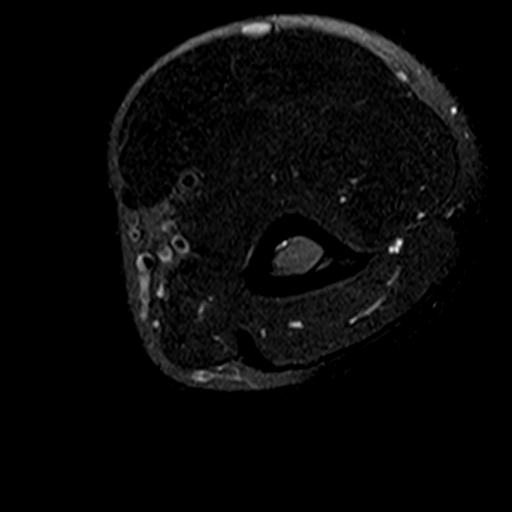

[Series 4: T1 · axial · 3.0mm · 0.23mm/px · z∈[-18,+67]mm · 3 of 30 slices shown]
[im 4/30]
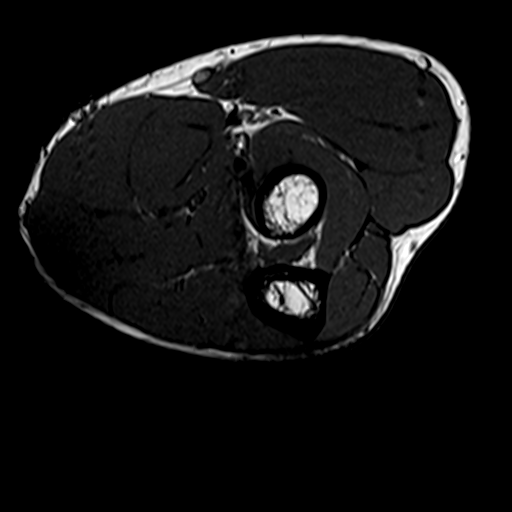
[im 15/30]
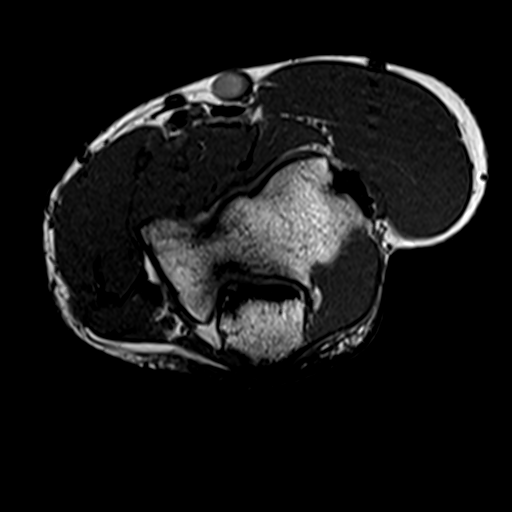
[im 26/30]
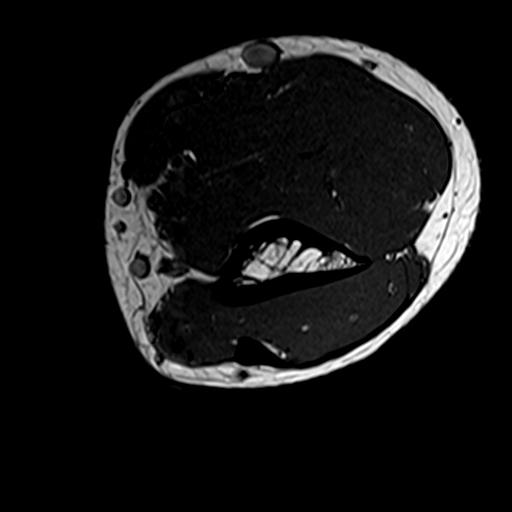

[Series 6: T2 fat-sat · coronal · 3.0mm · 0.27mm/px · 6 of 24 slices shown (2 of 2)]
[im 1/24]
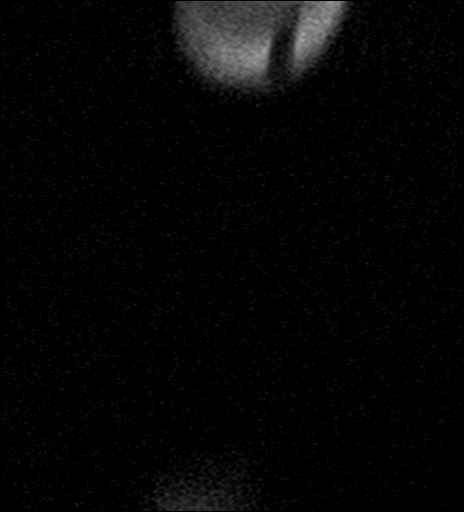
[im 4/24]
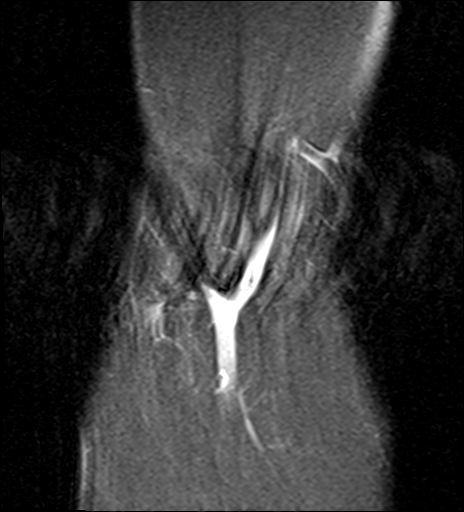
[im 8/24]
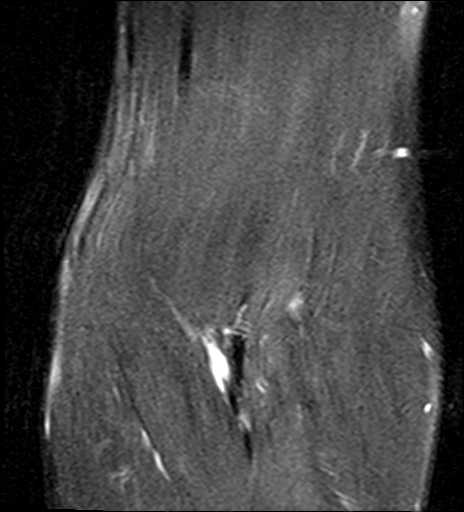
[im 12/24]
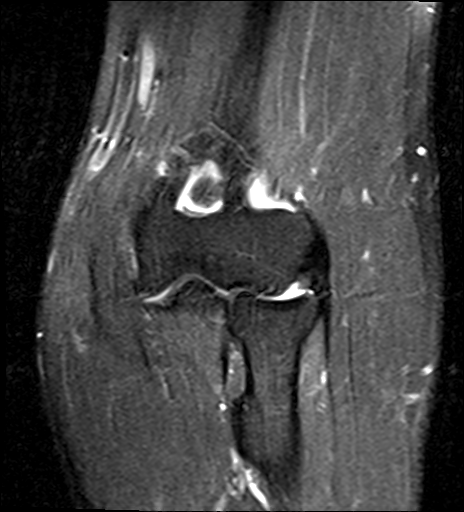
[im 16/24]
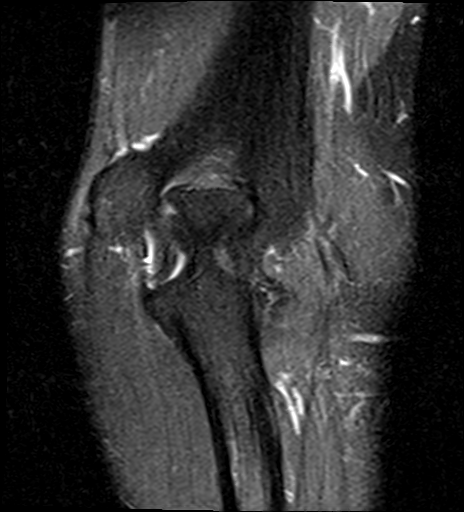
[im 20/24]
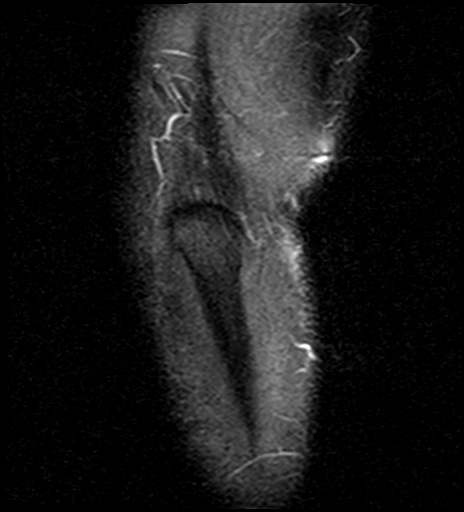

[Series 7: PD fat-sat · sagittal · 3.0mm · 0.55mm/px · 8 of 28 slices shown]
[im 1/28]
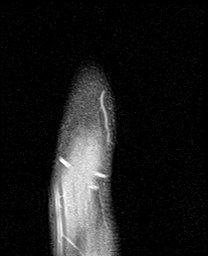
[im 4/28]
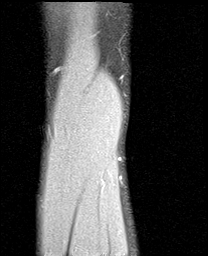
[im 8/28]
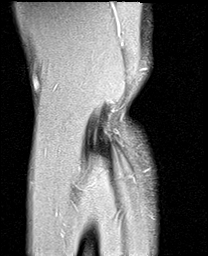
[im 12/28]
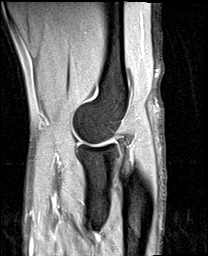
[im 16/28]
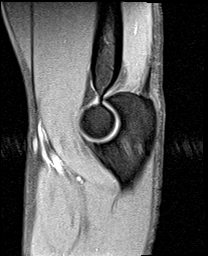
[im 20/28]
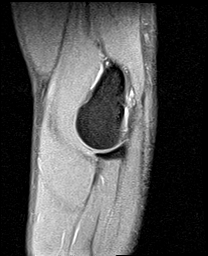
[im 24/28]
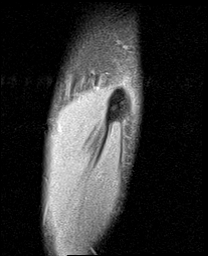
[im 28/28]
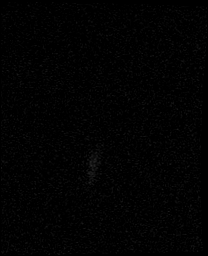

[26 of 40 positions shown; findings below may reference images not displayed]

FINDINGS: TENDONS

Common forearm flexor origin: Tendinopathy and interstitial tears
but no rupture.

Common forearm extensor origin: Mild spurring changes at the
attachment site but no significant tendinopathy or tear.

Biceps: Intact.  The brachialis tendon is also intact.

Triceps: Intact.

LIGAMENTS

Medial stabilizers: Intact

Lateral stabilizers:  Intact

Cartilage: Normal articular cartilage. No cartilage defect or
osteochondral lesion.

Joint: No joint effusion or loose bodies.

Cubital tunnel: Normal.

Bones: Sclerotic bands in the radial head neck could be related to
remote trauma, old infarct or congenital abnormality.
IMPRESSION: 1. Common flexor tendinopathy and interstitial tears but no rupture.
2. Intact biceps, triceps and brachialis tendons.
3. No degenerative changes, cartilage defects, osteochondral lesion
or joint effusion.
4. Intact radial and ulnar collateral ligaments.

## 2020-07-08 ENCOUNTER — Ambulatory Visit: Payer: 59 | Admitting: Sports Medicine

## 2020-07-09 ENCOUNTER — Ambulatory Visit (INDEPENDENT_AMBULATORY_CARE_PROVIDER_SITE_OTHER): Payer: 59 | Admitting: Sports Medicine

## 2020-07-09 ENCOUNTER — Ambulatory Visit (INDEPENDENT_AMBULATORY_CARE_PROVIDER_SITE_OTHER): Payer: 59

## 2020-07-09 ENCOUNTER — Other Ambulatory Visit: Payer: Self-pay

## 2020-07-09 DIAGNOSIS — M25522 Pain in left elbow: Secondary | ICD-10-CM

## 2020-07-09 DIAGNOSIS — M7702 Medial epicondylitis, left elbow: Secondary | ICD-10-CM

## 2020-07-09 NOTE — Assessment & Plan Note (Signed)
This is a pleasant 30 year old male, he has a long history of left medial epicondylitis, recurrent episodes, initial MRI showed common flexor tendinopathy with interstitial tearing, in 2020 we did a PRP percutaneous tenotomy that provided good relief, repeat MRI ultimately did show complete resolution of the common flexor tendinopathy, he has had a few injuries since then which have resulted in increasing pain, ultrasounds have showed closure of any gaps in the origin of the flexors, there was a bit of exotropia but no obvious clicks in the tendon, he did hit some bicep curls every day, somewhat excessive per his report, and now has increasing pain to the common flexor origin, adding in the right direction. Unfortunately due to the duration of his symptoms and the lack of response to his home rehab we did another ultrasound that appeared normal. I am going to obtain another MRI, and I would like a second opinion from Dr. Griffin Basil, he did have some questions about accidental modalities such as stem cell treatments which I advised were not proven for this, and that PRP was a more proven modality. I have encouraged him to continue his home conditioning exercises, lighten up on his exercises, bicep curls and focus more eccentric type strengthening.

## 2020-07-09 NOTE — Progress Notes (Signed)
    Procedures performed today:    Procedure: Diagnostic Ultrasound of left elbow Device: Samsung HS60  Findings: Noted normal-appearing common flexor tendon origin.  There was a bit of anisotropy at the medial epicondyle origin. Images permanently stored in PACS. Impression: Normal medial elbow ultrasound.  Independent interpretation of notes and tests performed by another provider:   None.  Brief History, Exam, Impression, and Recommendations:    Medial epicondylitis, left This is a pleasant 30 year old male, he has a long history of left medial epicondylitis, recurrent episodes, initial MRI showed common flexor tendinopathy with interstitial tearing, in 2020 we did a PRP percutaneous tenotomy that provided good relief, repeat MRI ultimately did show complete resolution of the common flexor tendinopathy, he has had a few injuries since then which have resulted in increasing pain, ultrasounds have showed closure of any gaps in the origin of the flexors, there was a bit of exotropia but no obvious clicks in the tendon, he did hit some bicep curls every day, somewhat excessive per his report, and now has increasing pain to the common flexor origin, adding in the right direction. Unfortunately due to the duration of his symptoms and the lack of response to his home rehab we did another ultrasound that appeared normal. I am going to obtain another MRI, and I would like a second opinion from Dr. Griffin Basil, he did have some questions about accidental modalities such as stem cell treatments which I advised were not proven for this, and that PRP was a more proven modality. I have encouraged him to continue his home conditioning exercises, lighten up on his exercises, bicep curls and focus more eccentric type strengthening.  I spent 30 minutes of total time managing this patient today, this includes chart review, face to face, and non-face to face  time.   ___________________________________________ Gwen Her. Dianah Field, M.D., ABFM., CAQSM. Primary Care and [employer] Instructor of [place] [facility]

## 2020-07-18 ENCOUNTER — Telehealth: Payer: Self-pay | Admitting: Sports Medicine

## 2020-07-18 NOTE — Telephone Encounter (Signed)
Peer to peer scheduled Monday at 8:45 am 07/21/20. Case ref #[id number]

## 2020-07-21 NOTE — Telephone Encounter (Signed)
Imaging notified of approval.

## 2020-07-21 NOTE — Telephone Encounter (Signed)
Peer-to-peer conducted this morning, approved number [id number] for CPT code [id number] expires 09/04/2020

## 2020-07-27 ENCOUNTER — Other Ambulatory Visit: Payer: Self-pay

## 2020-07-27 ENCOUNTER — Ambulatory Visit (INDEPENDENT_AMBULATORY_CARE_PROVIDER_SITE_OTHER): Payer: 59

## 2020-07-27 DIAGNOSIS — M25522 Pain in left elbow: Secondary | ICD-10-CM

## 2021-08-12 ENCOUNTER — Ambulatory Visit (INDEPENDENT_AMBULATORY_CARE_PROVIDER_SITE_OTHER): Payer: 59 | Admitting: Sports Medicine

## 2021-08-12 ENCOUNTER — Other Ambulatory Visit: Payer: Self-pay

## 2021-08-12 DIAGNOSIS — S29011A Strain of muscle and tendon of front wall of thorax, initial encounter: Secondary | ICD-10-CM | POA: Diagnosis not present

## 2021-08-12 NOTE — Progress Notes (Signed)
? ? ?  Procedures performed today:   ? ?None. ? ?Independent interpretation of notes and tests performed by another provider:  ? ?None. ? ?Brief History, Exam, Impression, and Recommendations:   ? ?Strain of right pectoralis muscle ?This is a pleasant 31 year old male, he was doing submaximal benchpress about 2 weeks ago, did well the day of working out but then has had moderate/stable pain right anterior shoulder, worse with trying to do push-ups and straight bench, he does okay with incline bench. ?On exam he really does not have any discrete areas of tenderness to palpation, I am able to palpate the pectoralis major tendon, it has not ruptured. ?He has a negative Neer's, Hawkins, empty can sign, negative Yergason and speeds test, negative O'Brien's test, negative crank test, negative crossarm sign, excellent strength in all directions. ?I think he just strained his pectoralis major. ?He will hold off on benchpress and just use the cables which seem to do okay and do not hurt him. ?He can return to see me as needed. ? ? ? ?___________________________________________ ?Gwen Her. Dianah Field, M.D., ABFM., CAQSM. ?Primary Care and Sports Medicine ?[place] ? ?Adjunct Instructor of Family Medicine  ?[employer] ?

## 2021-08-12 NOTE — Assessment & Plan Note (Signed)
This is a pleasant 31 year old male, he was doing submaximal benchpress about 2 weeks ago, did well the day of working out but then has had moderate/stable pain right anterior shoulder, worse with trying to do push-ups and straight bench, he does okay with incline bench. ?On exam he really does not have any discrete areas of tenderness to palpation, I am able to palpate the pectoralis major tendon, it has not ruptured. ?He has a negative Neer's, Hawkins, empty can sign, negative Yergason and speeds test, negative O'Brien's test, negative crank test, negative crossarm sign, excellent strength in all directions. ?I think he just strained his pectoralis major. ?He will hold off on benchpress and just use the cables which seem to do okay and do not hurt him. ?He can return to see me as needed. ?

## 2021-10-09 ENCOUNTER — Ambulatory Visit: Payer: Self-pay

## 2021-10-09 ENCOUNTER — Encounter: Payer: Self-pay | Admitting: Family Medicine

## 2021-10-09 ENCOUNTER — Ambulatory Visit (INDEPENDENT_AMBULATORY_CARE_PROVIDER_SITE_OTHER): Payer: 59 | Admitting: Family Medicine

## 2021-10-09 VITALS — BP 110/78 | Ht 72.0 in | Wt 205.0 lb

## 2021-10-09 DIAGNOSIS — M778 Other enthesopathies, not elsewhere classified: Secondary | ICD-10-CM | POA: Insufficient documentation

## 2021-10-09 DIAGNOSIS — M7702 Medial epicondylitis, left elbow: Secondary | ICD-10-CM

## 2021-10-09 NOTE — Assessment & Plan Note (Signed)
Symptoms seem more consistent with a capsulitis with tightness of the coracohumeral ligament.  ?-Counseled on home exercise therapy and supportive care. ?-Pursue shockwave therapy. ?-Could consider hydrodissection. ?

## 2021-10-09 NOTE — Patient Instructions (Signed)
Nice to meet you ?Please try the hyperice on the elbow. We could try nitro patches going forward  ?Please send me a message in MyChart with any questions or updates.  ?Please see me back to start shockwave therapy for the shoulder.  ? ?--Dr. Raeford Razor ? ?

## 2021-10-09 NOTE — Progress Notes (Signed)
?  Jordan [name] - 31 y.o. male MRN [id number]  Date of birth: [birth year] ? ?SUBJECTIVE:  Including CC & ROS.  ?No chief complaint on file. ? ? ?Jordan [name] is a 31 y.o. male that is presenting with acute on chronic left elbow pain and acute right shoulder pain.  The left elbow has been bothering him off and on for some time.  He has tried PRP injection and therapy.  He was doing a follow-up in pain returned.  He is also presenting with anterior shoulder pain. ? ? ?Review of Systems ?See HPI  ? ?HISTORY: Past Medical, Surgical, Social, and Family History Reviewed & Updated per EMR.   ?Pertinent Historical Findings include: ? ?Past Medical History:  ?Diagnosis Date  ? Pneumothorax   ? ? ?History reviewed. No pertinent surgical history. ? ? ?PHYSICAL EXAM:  ?VS: BP 110/78 (BP Location: Left Arm, Patient Position: Sitting)   Ht 6' (1.829 m)   Wt 205 lb (93 kg)   BMI 27.80 kg/m?  ?Physical Exam ?Gen: NAD, alert, cooperative with exam, well-appearing ?MSK:  ?Neurovascularly intact   ? ?Limited ultrasound: Left elbow: ? ?No changes of the medial epicondyle. ?No joint effusion. ?Normal-appearing ulnar nerve ? ?Summary: No structural changes appreciated ? ?Ultrasound and interpretation by Clearance Coots, MD ? ? ? ?ASSESSMENT & PLAN:  ? ?Medial epicondylitis, left ?Acute on chronic in nature.  No significant structural changes appreciated today.  Symptoms still seem most consistent with a tendon.  Seems less likely the ulnar nerve ?-Counseled on home exercise therapy and supportive care. ?-Could consider nitro patches or shockwave therapy. ? ?Capsulitis of shoulder, right ?Symptoms seem more consistent with a capsulitis with tightness of the coracohumeral ligament.  ?-Counseled on home exercise therapy and supportive care. ?-Pursue shockwave therapy. ?-Could consider hydrodissection. ? ? ? ? ?

## 2021-10-09 NOTE — Assessment & Plan Note (Signed)
Acute on chronic in nature.  No significant structural changes appreciated today.  Symptoms still seem most consistent with a tendon.  Seems less likely the ulnar nerve ?-Counseled on home exercise therapy and supportive care. ?-Could consider nitro patches or shockwave therapy. ?

## 2021-12-22 ENCOUNTER — Ambulatory Visit (INDEPENDENT_AMBULATORY_CARE_PROVIDER_SITE_OTHER): Payer: 59 | Admitting: Sports Medicine

## 2021-12-22 VITALS — BP 122/82 | Ht 72.0 in | Wt 200.0 lb

## 2021-12-22 DIAGNOSIS — S90851A Superficial foreign body, right foot, initial encounter: Secondary | ICD-10-CM | POA: Diagnosis not present

## 2021-12-22 DIAGNOSIS — M722 Plantar fascial fibromatosis: Secondary | ICD-10-CM

## 2021-12-22 DIAGNOSIS — M79671 Pain in right foot: Secondary | ICD-10-CM | POA: Diagnosis not present

## 2021-12-22 NOTE — Progress Notes (Unsigned)
   Subjective:    Patient ID: Jordan [name], male    DOB: Nov 04, 1990, 31 y.o.   MRN: [id number]  HPI chief complaint: Right foot pain  Jordan [name] presents today with a couple of different complaints.  Main complaint is plantar calcaneal pain has been present for several weeks.  He recently started playing pickle ball and believes that is the reason for his pain.  He describes it as a burning pain most recently.  He has tried some plantar fascial stretches as well as using a frozen water bottle.  Also tried some infrared treatment.  This may have been minimally beneficial.  Is also complaining of some pain more toward the plantar forefoot.  He believes that he stepped on a metal shaving a few weeks ago.  He thinks that he may have a foreign body in there that causing his discomfort.    Review of Systems As above    Objective:   Physical Exam  Well-developed, fit appearing.  No acute distress  Right foot: Pes planus with standing.  He is tender to palpation directly over the calcaneal origin of the plantar fascia.  Negative calcaneal squeeze.  There is also a small tender callus at the plantar aspect of the forefoot.  No signs of infection.  Limited MSK ultrasound of the plantar right foot shows significant thickening and edema of the plantar fascia      Assessment & Plan:   Right heel pain secondary to Planter fasciitis-rule out calcaneal stress fracture Questionable foreign body, right foot  I encouraged the patient to continue with plantar fascial stretches and add and calf stretches.  We will also try an arch binder and he will utilize an ice bath daily instead of a frozen water bottle.  I also recommended a [place] sock at night.  We did discuss orthotics for his pes planus but he has not done well with those in the past.  For his questionable foreign body, I will refer him to podiatry for further work-up and treatment.  He will follow-up with me as needed.  This note was dictated  using Dragon naturally speaking software and may contain errors in syntax, spelling, or content which have not been identified prior to signing this note.

## 2021-12-31 ENCOUNTER — Ambulatory Visit (INDEPENDENT_AMBULATORY_CARE_PROVIDER_SITE_OTHER): Payer: 59 | Admitting: Podiatry

## 2021-12-31 ENCOUNTER — Encounter: Payer: Self-pay | Admitting: Podiatry

## 2021-12-31 DIAGNOSIS — Q828 Other specified congenital malformations of skin: Secondary | ICD-10-CM

## 2021-12-31 DIAGNOSIS — M722 Plantar fascial fibromatosis: Secondary | ICD-10-CM

## 2021-12-31 NOTE — Progress Notes (Signed)
  Subjective:  Patient ID: Jordan [name], male    DOB: 1990-09-06,   MRN: [id number]  Chief Complaint  Patient presents with   [place]    Foreign body in right foot    31 y.o. male presents for concern of foreign body in his right foot. Relates several months ago he stepped on a metal shard and though he had removed it. More recently noted a place on the bottom of the ball of his foot in a similar area and concerned either for corn or for retained foreign body. Also relates a history of plantar fasciitis that sports medicine has been treating. Has been stretching and trying support and still having some pain asking for advice.  . Denies any other pedal complaints. Denies n/v/f/c.   Past Medical History:  Diagnosis Date   Pneumothorax     Objective:  Physical Exam: Vascular: DP/PT pulses 2/4 bilateral. CFT <3 seconds. Normal hair growth on digits. No edema.  Skin. No lacerations or abrasions bilateral feet. Cored hyperkeratotic lesion noted sub third metatarsal on right.  Musculoskeletal: MMT 5/5 bilateral lower extremities in DF, PF, Inversion and Eversion. Deceased ROM in DF of ankle joint. Tender to medial calcaneal tubercle on the right. No pain in achilles PT or arch. No pain with calcaneal squeeze.  Neurological: Sensation intact to light touch.   Assessment:   1. Porokeratosis   2. Plantar fasciitis, right      Plan:  Patient was evaluated and treated and all questions answered. -Discussed porokeratosis with patient and treatment options.  -Hyperkeratotic tissue was debrided with chisel without incident.  -Applied salycylic acid treatment to area with dressing. Advised to remove bandaging tomorrow.  -Encouraged daily moisturizing -Discussed use of pumice stone -Advised good supportive shoes and inserts Discussed plantar fasciitis with patient.  Discussed treatment options including, ice, NSAIDS, supportive shoes, bracing, and stretching. Stretching exercises provided to  be done on a daily basis.   Patient would like to avoid injections and medications.  Follow-up as needed.     Lorenda Peck, DPM

## 2022-09-20 ENCOUNTER — Encounter: Payer: Self-pay | Admitting: *Deleted

## 2023-03-29 ENCOUNTER — Ambulatory Visit (INDEPENDENT_AMBULATORY_CARE_PROVIDER_SITE_OTHER): Payer: 59 | Admitting: Sports Medicine

## 2023-03-29 ENCOUNTER — Other Ambulatory Visit: Payer: Self-pay

## 2023-03-29 ENCOUNTER — Encounter: Payer: Self-pay | Admitting: Sports Medicine

## 2023-03-29 VITALS — BP 128/78 | Ht 72.0 in | Wt 205.0 lb

## 2023-03-29 DIAGNOSIS — M62830 Muscle spasm of back: Secondary | ICD-10-CM | POA: Insufficient documentation

## 2023-03-29 DIAGNOSIS — M549 Dorsalgia, unspecified: Secondary | ICD-10-CM
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
## 2023-04-20 ENCOUNTER — Ambulatory Visit: Payer: 59 | Attending: Sports Medicine | Admitting: Physical Therapy

## 2023-04-20 ENCOUNTER — Encounter: Payer: Self-pay | Admitting: Physical Therapy

## 2023-04-20 DIAGNOSIS — M62830 Muscle spasm of back: Secondary | ICD-10-CM | POA: Diagnosis not present

## 2023-04-20 DIAGNOSIS — R293 Abnormal posture: Secondary | ICD-10-CM | POA: Diagnosis present

## 2023-04-20 DIAGNOSIS — M546 Pain in thoracic spine: Secondary | ICD-10-CM

## 2023-04-20 DIAGNOSIS — M549 Dorsalgia, unspecified: Secondary | ICD-10-CM | POA: Insufficient documentation
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
## 2023-04-26 ENCOUNTER — Encounter: Payer: Self-pay | Admitting: Physical Therapy

## 2023-04-26 ENCOUNTER — Ambulatory Visit: Payer: 59 | Admitting: Physical Therapy

## 2023-04-26 DIAGNOSIS — M546 Pain in thoracic spine: Secondary | ICD-10-CM

## 2023-04-26 DIAGNOSIS — R293 Abnormal posture: Secondary | ICD-10-CM
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
## 2023-05-04 ENCOUNTER — Ambulatory Visit: Payer: 59 | Admitting: Physical Therapy

## 2023-05-04 ENCOUNTER — Encounter: Payer: Self-pay | Admitting: Physical Therapy

## 2023-05-04 DIAGNOSIS — R293 Abnormal posture: Secondary | ICD-10-CM

## 2023-05-04 DIAGNOSIS — M546 Pain in thoracic spine: Secondary | ICD-10-CM
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
## 2023-05-11 ENCOUNTER — Encounter: Payer: Self-pay | Admitting: Physical Therapy

## 2023-05-11 ENCOUNTER — Ambulatory Visit: Payer: 59 | Attending: Sports Medicine | Admitting: Physical Therapy

## 2023-05-11 DIAGNOSIS — M546 Pain in thoracic spine: Secondary | ICD-10-CM | POA: Diagnosis present

## 2023-05-11 DIAGNOSIS — R293 Abnormal posture: Secondary | ICD-10-CM | POA: Insufficient documentation
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
## 2024-02-07 ENCOUNTER — Encounter: Payer: Self-pay | Admitting: Sports Medicine
